# Patient Record
Sex: Female | Born: 2000 | Race: Black or African American | Hispanic: No | Marital: Single | Smoking: Never smoker
Health system: Southern US, Community
[De-identification: ages and names within clinical notes are randomized; demographics above are authoritative.]

## PROBLEM LIST (undated history)

## (undated) DIAGNOSIS — J45909 Unspecified asthma, uncomplicated: Secondary | ICD-10-CM

## (undated) DIAGNOSIS — Z789 Other specified health status: Secondary | ICD-10-CM

## (undated) HISTORY — PX: EYE SURGERY: SHX253

---

## 2020-01-26 DIAGNOSIS — R519 Headache, unspecified: Secondary | ICD-10-CM | POA: Insufficient documentation

## 2020-08-19 DIAGNOSIS — J4540 Moderate persistent asthma, uncomplicated: Secondary | ICD-10-CM | POA: Insufficient documentation

## 2020-10-14 ENCOUNTER — Ambulatory Visit (HOSPITAL_COMMUNITY): Payer: Medicaid Other | Admitting: Licensed Clinical Social Worker

## 2020-10-14 DIAGNOSIS — F489 Nonpsychotic mental disorder, unspecified: Secondary | ICD-10-CM

## 2020-10-14 NOTE — Progress Notes (Signed)
Therapist contacted patient by text for session and patient did not respond. Session is a no show

## 2021-04-10 ENCOUNTER — Ambulatory Visit (INDEPENDENT_AMBULATORY_CARE_PROVIDER_SITE_OTHER): Payer: Medicaid Other | Admitting: Licensed Clinical Social Worker

## 2021-04-10 DIAGNOSIS — F321 Major depressive disorder, single episode, moderate: Secondary | ICD-10-CM | POA: Diagnosis not present

## 2021-04-10 DIAGNOSIS — F411 Generalized anxiety disorder: Secondary | ICD-10-CM | POA: Diagnosis not present
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
## 2021-05-14 ENCOUNTER — Ambulatory Visit (INDEPENDENT_AMBULATORY_CARE_PROVIDER_SITE_OTHER): Payer: Medicaid Other | Admitting: Licensed Clinical Social Worker

## 2021-05-14 DIAGNOSIS — F411 Generalized anxiety disorder: Secondary | ICD-10-CM | POA: Diagnosis not present

## 2021-05-14 DIAGNOSIS — F321 Major depressive disorder, single episode, moderate: Secondary | ICD-10-CM

## 2021-05-14 NOTE — Progress Notes (Signed)
Virtual Visit via Video Note  I connected with Joann Reid on 05/14/21 at 10:00 AM EDT by a video enabled telemedicine application and verified that I am speaking with the correct person using two identifiers.  Location: Patient: home office Provider: stationary car   I discussed the limitations of evaluation and management by telemedicine and the availability of in person appointments. The patient expressed understanding and agreed to proceed.  I discussed the assessment and treatment plan with the patient. The patient was provided an opportunity to ask questions and all were answered. The patient agreed with the plan and demonstrated an understanding of the instructions.   The patient was advised to call back or seek an in-person evaluation if the symptoms worsen or if the condition fails to improve as anticipated.  I provided 53 minutes of non-face-to-face time during this encounter.  THERAPIST PROGRESS NOTE  Session Time: 10:00 AM to 10:53 AM  Participation Level: Active  Behavioral Response: CasualAlertEuthymic  Type of Therapy: Individual Therapy  Treatment Goals addressed:  Work on more confidence, better lifestyle, depression, anxiety, anger, coping Interventions: Solution Focused, Strength-based, Supportive, Anger Management Training, and Other: coping  Summary: Joann Reid is a 20 y.o. female who presents with symptoms a little bit better not as angery as much not as hopeless and depressed. Anger better because not a home. Mostly at work and go home to sleep. Doesn't like those people at home her sister and boyfriend, mom but doesn't bother her. "They think I am a  slave." Explored why they expect that as they are adults. Sister has 6 month and expects triplets expects patient to always watch the baby. Sometimes want to help but take advantage of it so don't. Outlets boyfriend's house but doesn't like driving to [place] all the time. 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 Likes her jobs, some days more stressful with working with kids. Shares she doesn't like people give her anxiety and somebody always wants something from you. Noted since young all relationships people asking something from her impacts how she sees relationships and she agrees.  Worked on anger as it triggers other symptoms of depression and anxiety. Anger that is been build up related to early experiences of having to watch the kids all the time when younger, patient having to be a parent and not a kid.  Did helping to identify that is helpful in managing anger.  Reviewed anger management handout patient still thinks most helpful strategy is separating from family therapist did note this is a strategy to manage anger.  Noted self soothing for patient that she goes to her car, brings a candle and listens to music.  Reviewed session and patient said helped a little therapist emphasized constructive anger, containing her anger, constructive anger is more helpful in managing issues, walk away as a way to calm down emotional brain.  Therapist reviewed symptoms, facilitated expression of thoughts and feelings noted some progress in patient's symptoms noted less anger impacting other symptoms like depression.  Noted helpful for patient to separate from her trigger which is her family.  Completed treatment plan and patient gave verbal consent to complete virtually.  Worked on Tour manager and highlight important parts including behind anger is unmet needs so it is important to listen to the anger so can get needs met.  Focused on goal of working on constructive anger as it its effective well  destructive anger is not also destructive anger is about putting your needs ahead of other people that interferes with things like relationships.  Provided method for managing anger including motivating saying the cost of anger,  containing the anger and afterwards listening when you are calmer.  Also noted helpful to soften anger through constructive ways to look at situations and provided examples.  Noted patient is motivated as she wants to work on anger.  Noted important part of managing anger is containing it, one of the most effective ways is to walk away as emotional brain calms down and then reconnect to prefrontal cortex, usually takes about 20 minutes, soothing activities are helpful, noticing what you are grateful for noticing something you like about the person you are angry with.  The final step is listening noted as well can relate to all unrealistic expectations should statements can feed anger better to say I want to lower expectations.  Therapist provided active listening open questions, supportive interventions. Suicidal/Homicidal: No  Plan: Return again in 6 weeks.2.  Review anger management she and reviewed with patient anger management strategies that are helpful. 2.  Work on self-esteem using handout, from anxiety book, very well mind  Diagnosis: Axis I:  major depressive episode, single episode, mild, generalized anxiety disorder    Axis II: No diagnosis    Cordella Register, LCSW 05/14/2021

## 2021-06-25 ENCOUNTER — Ambulatory Visit (HOSPITAL_COMMUNITY): Payer: Medicaid Other | Admitting: Licensed Clinical Social Worker

## 2021-10-30 DIAGNOSIS — K589 Irritable bowel syndrome without diarrhea: Secondary | ICD-10-CM | POA: Insufficient documentation

## 2021-11-30 NOTE — L&D Delivery Note (Signed)
OB/GYN Faculty Practice Delivery Note  Joann Reid is a 21 y.o. G1P0 s/p VD at [redacted]w[redacted]d. She was admitted for IOL gHTN.   ROM: 9h 27m with Clear fluid GBS Status:  Negative/-- (11/09 0000)  Labor Progress: Initial SVE: 1.5/50/-2. She then progressed to complete.   Delivery Date/Time: 10/25/22 0216 Delivery: Called to room and patient was complete and pushing. Head delivered LOA. No nuchal cord present. Shoulder and body delivered in usual fashion. Infant with spontaneous cry, placed on mother's abdomen, dried and stimulated. Cord clamped x 2 after 1-minute delay, and cut by family. Cord blood drawn. Placenta delivered spontaneously with gentle cord traction. Fundus firm with massage and Pitocin. Labia, perineum, vagina, and cervix inspected with 2nd degree laceration and bilateral periurethral lacerations, repaired in usual fashion.  Baby Weight: pending  Placenta: 3 vessel, intact. Sent to L&D Complications: None Lacerations: 2nd degree and bilateral periurethral lacerations EBL: 502 mL Analgesia: Epidural   Infant:  APGAR (1 MIN): 7   APGAR (5 MINS): 8    Myrtie Hawk, DO OB Family Medicine Fellow, [facility] for [employer], [facility] 10/25/2022, 2:57 AM

## 2022-04-01 ENCOUNTER — Encounter: Payer: Self-pay | Admitting: Obstetrics & Gynecology

## 2022-04-20 ENCOUNTER — Other Ambulatory Visit (HOSPITAL_COMMUNITY)
Admission: RE | Admit: 2022-04-20 | Discharge: 2022-04-20 | Disposition: A | Discharge status: Home / Self Care | Payer: Managed Care, Other (non HMO) | Source: Ambulatory Visit | Attending: Obstetrics & Gynecology | Admitting: Obstetrics & Gynecology

## 2022-04-20 ENCOUNTER — Ambulatory Visit (INDEPENDENT_AMBULATORY_CARE_PROVIDER_SITE_OTHER): Payer: Medicaid Other | Admitting: Obstetrics & Gynecology

## 2022-04-20 ENCOUNTER — Encounter: Payer: Self-pay | Admitting: Obstetrics & Gynecology

## 2022-04-20 VITALS — BP 123/75 | HR 81 | Ht 65.0 in | Wt 212.0 lb

## 2022-04-20 DIAGNOSIS — Z3402 Encounter for supervision of normal first pregnancy, second trimester: Secondary | ICD-10-CM | POA: Diagnosis present

## 2022-04-20 DIAGNOSIS — Z3A13 13 weeks gestation of pregnancy: Secondary | ICD-10-CM | POA: Diagnosis not present

## 2022-04-20 DIAGNOSIS — Z3400 Encounter for supervision of normal first pregnancy, unspecified trimester: Secondary | ICD-10-CM | POA: Insufficient documentation

## 2022-04-20 DIAGNOSIS — Z3401 Encounter for supervision of normal first pregnancy, first trimester: Secondary | ICD-10-CM

## 2022-04-20 LAB — HEPATITIS C ANTIBODY: HCV Ab: NEGATIVE

## 2022-04-20 MED ORDER — ONDANSETRON HCL 4 MG PO TABS: 4.0000 mg | ORAL_TABLET | Freq: Three times a day (TID) | ORAL | 0 refills | Status: DC | PRN

## 2022-04-20 MED ORDER — POLYETHYLENE GLYCOL 3350 17 GM/SCOOP PO POWD: 1.0000 | Freq: Once | ORAL | Status: DC

## 2022-04-20 MED ORDER — BLOOD PRESSURE CUFF MISC: 1.0000 | 0 refills | Status: DC | PRN

## 2022-04-20 NOTE — Progress Notes (Signed)
Bedside TAU shows single IUP with FHT of 160 BPM and HC is measuring [redacted]w[redacted]d.

## 2022-04-20 NOTE — Progress Notes (Signed)
  Subjective:    Joann Reid is a G1P0 [redacted]w[redacted]d being seen today for her first obstetrical visit.  Her obstetrical history is significant for obesity. Patient does not intend to breast feed. Pt encouraged to consider.  Patient reports fatigue, nausea, and vomiting.  Vitals:   04/16/22 1631  BP: 123/75  Pulse: 81  Weight: 212 lb (96.2 kg)  Height: 5\' 5"  (1.651 m)    HISTORY: OB History  Gravida Para Term Preterm AB Living  1            SAB IAB Ectopic Multiple Live Births               # Outcome Date GA Lbr Len/2nd Weight Sex Delivery Anes PTL Lv  1 Current            History reviewed. No pertinent past medical history. Past Surgical History:  Procedure Laterality Date   EYE SURGERY     Family History  Problem Relation Age of Onset   Hypertension Mother    Diabetes Maternal Grandmother      Exam    Uterus:     Pelvic Exam:    Perineum: No Hemorrhoids   Vulva: normal   Vagina:  normal mucosa, normal discharge   pH: N/a   Cervix: no bleeding following Pap   Adnexa: normal adnexa   Bony Pelvis: average  System: Breast:  normal appearance, no masses or tenderness   Skin: normal coloration and turgor, no rashes    Neurologic: oriented, normal mood   Extremities: no deformities   HEENT sclera clear, anicteric, oropharynx clear, no lesions, neck supple with midline trachea, thyroid without masses, and trachea midline   Mouth/Teeth mucous membranes moist, pharynx normal without lesions and dental hygiene good   Neck supple and no masses   Cardiovascular: regular rate and rhythm   Respiratory:  appears well, vitals normal, no respiratory distress, acyanotic, normal RR, chest clear, no wheezing, crepitations, rhonchi, normal symmetric air entry   Abdomen: soft, non-tender; bowel sounds normal; no masses,  no organomegaly   Urinary: urethral meatus normal      Assessment:    Pregnancy: G1P0 Patient Active Problem List   Diagnosis Date Noted   Supervision of  normal first pregnancy 04/20/2022   IBS (irritable bowel syndrome) 10/30/2021   Moderate persistent asthma 08/19/2020   Nonintractable episodic headache 01/26/2020        Plan:   Initial labs drawn. Prenatal vitamins. Problem list reviewed and updated. Genetic Screening discussed:  Panorama today and AFP at next visit  Ultrasound discussed; fetal survey: ordered.  Follow up in 4 weeks.  BP surveillance:  Rx for BP cuff given  N/V--prescription for Zofran given.  Patient has lost 1 pound since pregnancy began.  We did discuss total weight and being approximately 12 pounds.  No history of diabetes or gestational diabetes in her family.  Father of baby and Joann Reid are not currently together.  Her mother, also a patient in the practice, was present for the visit and supportive.  RTC 4-5 weeks Pt wants a gender reveal party so we should not reveal the sex through testing nor [place].   Joann Reid 04/20/2022

## 2022-04-21 ENCOUNTER — Encounter: Payer: Self-pay | Admitting: Obstetrics & Gynecology

## 2022-04-21 LAB — HEPATITIS C ANTIBODY
Hepatitis C Ab: NONREACTIVE
SIGNAL TO CUT-OFF: 0.21 (ref <1.00)

## 2022-04-21 LAB — OBSTETRIC PANEL
Absolute Monocytes: 533 cells/uL (ref 200–950)
Antibody Screen: NOT DETECTED
Basophils Absolute: 17 cells/uL (ref 0–200)
Basophils Relative: 0.2 %
Eosinophils Absolute: 17 cells/uL (ref 15–500)
Eosinophils Relative: 0.2 %
HCT: 38.1 % (ref 35.0–45.0)
Hemoglobin: 12.9 g/dL (ref 11.7–15.5)
Hepatitis B Surface Ag: NONREACTIVE
Lymphs Abs: 1909 cells/uL (ref 850–3900)
MCH: 32.1 pg (ref 27.0–33.0)
MCHC: 33.9 g/dL (ref 32.0–36.0)
MCV: 94.8 fL (ref 80.0–100.0)
MPV: 10.5 fL (ref 7.5–12.5)
Monocytes Relative: 6.2 %
Neutro Abs: 6123 cells/uL (ref 1500–7800)
Neutrophils Relative %: 71.2 %
Platelets: 254 10*3/uL (ref 140–400)
RBC: 4.02 10*6/uL (ref 3.80–5.10)
RDW: 12.2 % (ref 11.0–15.0)
RPR Ser Ql: NONREACTIVE
Rubella: 1.57 Index
Total Lymphocyte: 22.2 %
WBC: 8.6 10*3/uL (ref 3.8–10.8)

## 2022-04-21 LAB — CERVICOVAGINAL ANCILLARY ONLY
Chlamydia: NEGATIVE
Comment: NEGATIVE
Comment: NEGATIVE
Comment: NORMAL
Neisseria Gonorrhea: NEGATIVE
Trichomonas: NEGATIVE

## 2022-04-21 LAB — CYTOLOGY - PAP
Chlamydia: NEGATIVE
Comment: NEGATIVE
Comment: NORMAL
Diagnosis: NEGATIVE
Neisseria Gonorrhea: NEGATIVE

## 2022-04-21 LAB — HIV ANTIBODY (ROUTINE TESTING W REFLEX): HIV 1&2 Ab, 4th Generation: NONREACTIVE

## 2022-04-23 ENCOUNTER — Telehealth: Payer: Self-pay | Admitting: *Deleted

## 2022-04-23 MED ORDER — POLYETHYLENE GLYCOL 3350 17 GM/SCOOP PO POWD: 3.0000 | Freq: Once | ORAL | 0 refills | Status: AC

## 2022-04-23 NOTE — Telephone Encounter (Signed)
RX for Miralax sent to [facility] in [place] per Dr Leggett's office note.

## 2022-04-24 LAB — CULTURE, OB URINE

## 2022-04-24 LAB — URINE CULTURE, OB REFLEX

## 2022-04-30 DIAGNOSIS — Z3402 Encounter for supervision of normal first pregnancy, second trimester: Secondary | ICD-10-CM

## 2022-05-04 ENCOUNTER — Encounter: Payer: Self-pay | Admitting: *Deleted

## 2022-05-04 DIAGNOSIS — Z3402 Encounter for supervision of normal first pregnancy, second trimester: Secondary | ICD-10-CM

## 2022-05-11 ENCOUNTER — Inpatient Hospital Stay (HOSPITAL_COMMUNITY)
Admission: AD | Admit: 2022-05-11 | Discharge: 2022-05-11 | Disposition: A | Discharge status: Home / Self Care | Payer: Medicaid Other | Attending: Obstetrics and Gynecology | Admitting: Obstetrics and Gynecology

## 2022-05-11 ENCOUNTER — Encounter: Payer: Self-pay | Admitting: Obstetrics & Gynecology

## 2022-05-11 DIAGNOSIS — Z3A16 16 weeks gestation of pregnancy: Secondary | ICD-10-CM | POA: Insufficient documentation

## 2022-05-11 DIAGNOSIS — B9689 Other specified bacterial agents as the cause of diseases classified elsewhere: Secondary | ICD-10-CM

## 2022-05-11 DIAGNOSIS — O219 Vomiting of pregnancy, unspecified: Secondary | ICD-10-CM | POA: Diagnosis not present

## 2022-05-11 DIAGNOSIS — Z3402 Encounter for supervision of normal first pregnancy, second trimester: Secondary | ICD-10-CM

## 2022-05-11 DIAGNOSIS — O26892 Other specified pregnancy related conditions, second trimester: Secondary | ICD-10-CM | POA: Insufficient documentation

## 2022-05-11 DIAGNOSIS — M549 Dorsalgia, unspecified: Secondary | ICD-10-CM | POA: Diagnosis present

## 2022-05-11 DIAGNOSIS — M545 Low back pain, unspecified: Secondary | ICD-10-CM

## 2022-05-11 LAB — URINALYSIS, ROUTINE W REFLEX MICROSCOPIC
Bilirubin Urine: NEGATIVE
Glucose, UA: NEGATIVE mg/dL
Hgb urine dipstick: NEGATIVE
Ketones, ur: 80 mg/dL — AB
Leukocytes,Ua: NEGATIVE
Nitrite: NEGATIVE
Protein, ur: NEGATIVE mg/dL
Specific Gravity, Urine: 1.023 (ref 1.005–1.030)
pH: 5 (ref 5.0–8.0)

## 2022-05-11 LAB — WET PREP, GENITAL
Sperm: NONE SEEN
Trich, Wet Prep: NONE SEEN
WBC, Wet Prep HPF POC: >=10 — AB (ref <10)
Yeast Wet Prep HPF POC: NONE SEEN

## 2022-05-11 MED ORDER — METOCLOPRAMIDE HCL 10 MG PO TABS: 10.0000 mg | ORAL_TABLET | Freq: Once | ORAL | Status: DC

## 2022-05-11 MED ORDER — CYCLOBENZAPRINE HCL 5 MG PO TABS
10.0000 mg | ORAL_TABLET | Freq: Once | ORAL | Status: AC
  Administered 2022-05-11: 10 mg via ORAL
  Filled 2022-05-11: qty 2

## 2022-05-11 MED ORDER — METOCLOPRAMIDE HCL 10 MG PO TABS
10.0000 mg | ORAL_TABLET | Freq: Once | ORAL | Status: AC
Start: 2022-05-11 — End: 2022-05-11
  Administered 2022-05-11: 10 mg via ORAL
  Filled 2022-05-11: qty 1

## 2022-05-11 MED ORDER — PROMETHAZINE HCL 12.5 MG PO TABS: 12.5000 mg | ORAL_TABLET | Freq: Four times a day (QID) | ORAL | 0 refills | Status: DC | PRN

## 2022-05-11 MED ORDER — METRONIDAZOLE 0.75 % VA GEL
1.0000 | Freq: Every day | VAGINAL | 0 refills | Status: DC
Start: 2022-05-11 — End: 2022-05-21

## 2022-05-11 NOTE — MAU Provider Note (Signed)
History     CSN: [id number]  Arrival date and time: 05/11/22 1925   Event Date/Time   First Provider Initiated Contact with Patient 05/11/22 2100      Chief Complaint  Patient presents with   Vaginal Bleeding   Joann Reid is a 21 y.o. G1P0 at [redacted]w[redacted]d who receives care at [facility]. Patient reports her next appt is June 22.   She presents today for Vaginal Spotting. Joann Reid reports the spotting started this morning around 0200.  She states it is "like you are about to come off your cycle" and endorses that it is light pink in color.  She also endorses cramping that is constant that started 2 weeks ago.  She has not taken any medication for the cramping and reports certain positions cause worsening of her symptoms.  She rates the pain a 10/10.  She also reports nausea and ~ 6 bouts of vomiting today.  She reports taking one Zofran today at 0700 with no relief of symptoms.     OB History     Gravida  1   Para      Term      Preterm      AB      Living         SAB      IAB      Ectopic      Multiple      Live Births              No past medical history on file.  Past Surgical History:  Procedure Laterality Date   EYE SURGERY      Family History  Problem Relation Age of Onset   Hypertension Mother    Diabetes Maternal Grandmother     Social History   Tobacco Use   Smoking status: Never   Smokeless tobacco: Never  Vaping Use   Vaping Use: Never used  Substance Use Topics   Alcohol use: Never   Drug use: Never    Allergies:  Allergies  Allergen Reactions   Hydrocodone     Facility-Administered Medications Prior to Admission  Medication Dose Route Frequency Provider Last Rate Last Admin   polyethylene glycol powder (GLYCOLAX/MIRALAX) container 255 g  1 Container Oral Once Joann Dukes, MD       Medications Prior to Admission  Medication Sig Dispense Refill Last Dose   Blood Pressure Monitoring (BLOOD PRESSURE CUFF) MISC 1  Device by Does not apply route as needed. 1 each 0    ondansetron (ZOFRAN) 4 MG tablet Take 1 tablet (4 mg total) by mouth every 8 (eight) hours as needed for nausea or vomiting. 20 tablet 0    Prenatal Vit-Fe Fumarate-FA (MULTIVITAMIN-PRENATAL) 27-0.8 MG TABS tablet Take 1 tablet by mouth daily at 12 noon.       Review of Systems  Constitutional:  Negative for chills and fever.  Eyes:  Negative for visual disturbance.  Gastrointestinal:  Positive for abdominal pain, nausea and vomiting (Vomiting upon arrival). Negative for constipation and diarrhea.  Genitourinary:  Positive for vaginal bleeding (Spotting). Negative for difficulty urinating, dysuria and vaginal discharge.  Skin:        "Feels like my skin is on fire."  Neurological:  Positive for headaches. Negative for dizziness and light-headedness.   Physical Exam   Blood pressure 119/70, pulse 87, temperature 98.1 F (36.7 C), resp. rate 18, height 5\' 5"  (1.651 m), weight 96.1 kg, last menstrual period 01/14/2022.  Physical Exam Vitals  reviewed.  Constitutional:      General: She is not in acute distress.    Appearance: Normal appearance. She is not ill-appearing.  HENT:     Head: Normocephalic and atraumatic.  Eyes:     Conjunctiva/sclera: Conjunctivae normal.  Cardiovascular:     Rate and Rhythm: Normal rate and regular rhythm.  Pulmonary:     Effort: Pulmonary effort is normal. No respiratory distress.     Breath sounds: Normal breath sounds.  Abdominal:     General: Bowel sounds are normal.     Palpations: Abdomen is soft.     Tenderness: There is abdominal tenderness in the right lower quadrant and left lower quadrant. There is no right CVA tenderness or left CVA tenderness.  Musculoskeletal:     Lumbar back: Tenderness (Right lower side only) present.     Right lower leg: No edema.     Left lower leg: No edema.  Skin:    General: Skin is warm and dry.  Neurological:     Mental Status: She is alert and oriented  to person, place, and time.  Psychiatric:        Mood and Affect: Mood normal.        Behavior: Behavior normal.     MAU Course  Procedures Results for orders placed or performed during the hospital encounter of 05/11/22 (from the past 24 hour(s))  Wet prep, genital     Status: Abnormal   Collection Time: 05/11/22  8:15 PM   Specimen: PATH Cytology Cervicovaginal Ancillary Only  Result Value Ref Range   Yeast Wet Prep HPF POC NONE SEEN NONE SEEN   Trich, Wet Prep NONE SEEN NONE SEEN   Clue Cells Wet Prep HPF POC PRESENT (A) NONE SEEN   WBC, Wet Prep HPF POC >=10 (A) <10   Sperm NONE SEEN   Urinalysis, Routine w reflex microscopic Urine, Clean Catch     Status: Abnormal   Collection Time: 05/11/22  8:26 PM  Result Value Ref Range   Color, Urine YELLOW YELLOW   APPearance HAZY (A) CLEAR   Specific Gravity, Urine 1.023 1.005 - 1.030   pH 5.0 5.0 - 8.0   Glucose, UA NEGATIVE NEGATIVE mg/dL   Hgb urine dipstick NEGATIVE NEGATIVE   Bilirubin Urine NEGATIVE NEGATIVE   Ketones, ur 80 (A) NEGATIVE mg/dL   Protein, ur NEGATIVE NEGATIVE mg/dL   Nitrite NEGATIVE NEGATIVE   Leukocytes,Ua NEGATIVE NEGATIVE    MDM Exam Labs: UA, Wet Prep, GC/CT AntiEmetics Muscle Relaxant Assessment and Plan  21 year old SIUP at 16.5 weeks Cramping Spotting Back pain  -POC Reviewed. -Swabs collected by patient. -Discussed results c/w bacterial vaginosis. -Treatment to be sent to pharmacy. -Discussed pain management and patient agreeable after conversation. -Reglan and Flexeril ordered. -Monitor and reassess.   Joann Reid 05/11/2022, 9:00 PM   Reassessment (10:46 PM)  -Patient reports no vomiting, but some "dry heaving." -Discussed management of back pain at home. -Encouraged usage of warm compresses and tylenol prn.  -Rx for phenergan and reglan sent to pharmacy on file.  -Patient endorses being around marijuana smoke, but not using during pregnancy.  Encouraged limiting exposure  and continued cessation. -Instructed to keep appt as scheduled. -Encouraged to call primary office or return to MAU if symptoms worsen or with the onset of new symptoms. -Discharged to home in stable condition.  Joann Robins MSN, CNM Advanced Practice Provider, [facility] [employer]

## 2022-05-11 NOTE — MAU Note (Addendum)
Pt says she has VB- started  at 0200- when she wiped  and has continued through day . Feels cramps - started at 0800- no meds - says Tylenol and ibuprofen does not help her when she has pain. [facility]- [place]  Last sex- 03-30-2022 with different partner

## 2022-05-12 LAB — GC/CHLAMYDIA PROBE AMP (~~LOC~~) NOT AT ARMC
Chlamydia: NEGATIVE
Comment: NEGATIVE
Comment: NORMAL
Neisseria Gonorrhea: NEGATIVE

## 2022-05-13 ENCOUNTER — Encounter: Payer: Self-pay | Admitting: Obstetrics & Gynecology

## 2022-05-21 ENCOUNTER — Ambulatory Visit (INDEPENDENT_AMBULATORY_CARE_PROVIDER_SITE_OTHER): Payer: Medicaid Other | Admitting: Obstetrics and Gynecology

## 2022-05-21 ENCOUNTER — Inpatient Hospital Stay (HOSPITAL_COMMUNITY): Payer: Medicaid Other

## 2022-05-21 ENCOUNTER — Encounter: Payer: Self-pay | Admitting: Obstetrics and Gynecology

## 2022-05-21 ENCOUNTER — Encounter (HOSPITAL_COMMUNITY): Payer: Self-pay | Admitting: Family Medicine

## 2022-05-21 ENCOUNTER — Encounter: Payer: Self-pay | Admitting: Certified Nurse Midwife

## 2022-05-21 ENCOUNTER — Inpatient Hospital Stay (HOSPITAL_COMMUNITY)
Admission: AD | Admit: 2022-05-21 | Discharge: 2022-05-21 | Disposition: A | Discharge status: Home / Self Care | Payer: Medicaid Other | Attending: Family Medicine | Admitting: Family Medicine

## 2022-05-21 VITALS — BP 118/76 | HR 90 | Temp 98.9°F | Wt 211.0 lb

## 2022-05-21 DIAGNOSIS — R1032 Left lower quadrant pain: Secondary | ICD-10-CM | POA: Diagnosis not present

## 2022-05-21 DIAGNOSIS — N949 Unspecified condition associated with female genital organs and menstrual cycle: Secondary | ICD-10-CM

## 2022-05-21 DIAGNOSIS — R309 Painful micturition, unspecified: Secondary | ICD-10-CM

## 2022-05-21 DIAGNOSIS — Z3A18 18 weeks gestation of pregnancy: Secondary | ICD-10-CM

## 2022-05-21 DIAGNOSIS — R102 Pelvic and perineal pain: Secondary | ICD-10-CM | POA: Insufficient documentation

## 2022-05-21 DIAGNOSIS — Z3402 Encounter for supervision of normal first pregnancy, second trimester: Secondary | ICD-10-CM | POA: Diagnosis not present

## 2022-05-21 DIAGNOSIS — O26892 Other specified pregnancy related conditions, second trimester: Secondary | ICD-10-CM | POA: Diagnosis present

## 2022-05-21 HISTORY — DX: Other specified health status: Z78.9

## 2022-05-21 LAB — CBC WITH DIFFERENTIAL/PLATELET
Abs Immature Granulocytes: 0.05 10*3/uL (ref 0.00–0.07)
Basophils Absolute: 0 10*3/uL (ref 0.0–0.1)
Basophils Relative: 0 %
Eosinophils Absolute: 0 10*3/uL (ref 0.0–0.5)
Eosinophils Relative: 0 %
HCT: 39.3 % (ref 36.0–46.0)
Hemoglobin: 13.5 g/dL (ref 12.0–15.0)
Immature Granulocytes: 0 %
Lymphocytes Relative: 17 %
Lymphs Abs: 2 10*3/uL (ref 0.7–4.0)
MCH: 31.7 pg (ref 26.0–34.0)
MCHC: 34.4 g/dL (ref 30.0–36.0)
MCV: 92.3 fL (ref 80.0–100.0)
Monocytes Absolute: 0.7 10*3/uL (ref 0.1–1.0)
Monocytes Relative: 6 %
Neutro Abs: 8.9 10*3/uL — ABNORMAL HIGH (ref 1.7–7.7)
Neutrophils Relative %: 77 %
Platelets: 263 10*3/uL (ref 150–400)
RBC: 4.26 MIL/uL (ref 3.87–5.11)
RDW: 11.6 % (ref 11.5–15.5)
WBC: 11.7 10*3/uL — ABNORMAL HIGH (ref 4.0–10.5)
nRBC: 0 % (ref 0.0–0.2)

## 2022-05-21 LAB — POCT URINALYSIS DIPSTICK
Bilirubin, UA: NEGATIVE
Glucose, UA: NEGATIVE
Ketones, UA: NEGATIVE
Nitrite, UA: NEGATIVE
Protein, UA: NEGATIVE
Spec Grav, UA: 1.01 (ref 1.010–1.025)
Urobilinogen, UA: NEGATIVE E.U./dL — AB
pH, UA: 7 (ref 5.0–8.0)

## 2022-05-21 LAB — TYPE AND SCREEN
ABO/RH(D): O POS
Antibody Screen: NEGATIVE

## 2022-05-21 LAB — URINALYSIS, ROUTINE W REFLEX MICROSCOPIC
Bilirubin Urine: NEGATIVE
Glucose, UA: NEGATIVE mg/dL
Ketones, ur: NEGATIVE mg/dL
Nitrite: NEGATIVE
Protein, ur: NEGATIVE mg/dL
Specific Gravity, Urine: 1.019 (ref 1.005–1.030)
pH: 5 (ref 5.0–8.0)

## 2022-05-21 LAB — COMPREHENSIVE METABOLIC PANEL
ALT: 12 U/L (ref 0–44)
AST: 19 U/L (ref 15–41)
Albumin: 3.5 g/dL (ref 3.5–5.0)
Alkaline Phosphatase: 68 U/L (ref 38–126)
Anion gap: 10 (ref 5–15)
BUN: <5 mg/dL — ABNORMAL LOW (ref 6–20)
CO2: 19 mmol/L — ABNORMAL LOW (ref 22–32)
Calcium: 9.4 mg/dL (ref 8.9–10.3)
Chloride: 106 mmol/L (ref 98–111)
Creatinine, Ser: 0.6 mg/dL (ref 0.44–1.00)
GFR, Estimated: >60 mL/min (ref >60)
Glucose, Bld: 97 mg/dL (ref 70–99)
Potassium: 3.9 mmol/L (ref 3.5–5.1)
Sodium: 135 mmol/L (ref 135–145)
Total Bilirubin: 0.3 mg/dL (ref 0.3–1.2)
Total Protein: 6.9 g/dL (ref 6.5–8.1)

## 2022-05-21 LAB — AMNISURE RUPTURE OF MEMBRANE (ROM) NOT AT ARMC: Amnisure ROM: NEGATIVE

## 2022-05-21 MED ORDER — MORPHINE SULFATE (PF) 4 MG/ML IV SOLN
1.0000 mg | Freq: Once | INTRAVENOUS | Status: AC
  Administered 2022-05-21: 1 mg via INTRAVENOUS
  Filled 2022-05-21: qty 1

## 2022-05-21 MED ORDER — CYCLOBENZAPRINE HCL 10 MG PO TABS: 10.0000 mg | ORAL_TABLET | Freq: Two times a day (BID) | ORAL | 0 refills | Status: DC | PRN

## 2022-05-21 MED ORDER — MISC. DEVICES MISC
0 refills | Status: DC
Start: 2022-05-21 — End: 2022-10-25

## 2022-05-21 MED ORDER — METRONIDAZOLE 0.75 % VA GEL
1.0000 | Freq: Every day | VAGINAL | 1 refills | Status: DC
Start: 2022-05-21 — End: 2022-06-19

## 2022-05-21 MED ORDER — LACTATED RINGERS IV BOLUS
1000.0000 mL | Freq: Once | INTRAVENOUS | Status: AC
  Administered 2022-05-21: 1000 mL via INTRAVENOUS

## 2022-05-21 NOTE — Progress Notes (Signed)
C/O left abd pain and pressure with urination. UA dip is positive for blood and culture sent

## 2022-05-21 NOTE — MAU Provider Note (Cosign Needed)
History     CSN: [id number]  Arrival date and time: 05/21/22 1254   Event Date/Time   First Provider Initiated Contact with Patient 05/21/22 1329      Chief Complaint  Patient presents with   Abdominal Pain   HPI Joann Reid is a 21 y.o. G1P0 at [redacted]w[redacted]d who presents to MAU from [facility] with concern for Pyelonephritis. On arrival to MAU patient reports LLQ abdominal pain. This is a new problem, onset three days ago. Pain score is 10/10. Pain does not radiate. Pain is unchanged since onset and she denies alleviating factors. Pain is aggravated by walking and repositioning. She has not taken medication for this complaint.   Patient reports recurrent constipation. Prior to pregnancy she had a bowel movement every day. Now she has one bowel movement every three days. Most recent was this morning.  Patient reported LLQ pain with radiation to left flank as well as dysuria during her office appointment this morning. She denies both flank pain and dysuria on arrival to MAU and on CNM initial assessment.   Patient reports falling at work on March 26, 2022. She states she fell onto her left side "and its all been downhill since then". She reports intermittent tingling in her right leg. She also reports soreness at her left hip flexor.  OB History     Gravida  1   Para      Term      Preterm      AB      Living         SAB      IAB      Ectopic      Multiple      Live Births              Past Medical History:  Diagnosis Date   Medical history non-contributory     Past Surgical History:  Procedure Laterality Date   EYE SURGERY      Family History  Problem Relation Age of Onset   Hypertension Mother    Diabetes Maternal Grandmother     Social History   Tobacco Use   Smoking status: Never   Smokeless tobacco: Never  Vaping Use   Vaping Use: Never used  Substance Use Topics   Alcohol use: Never   Drug use: Never    Allergies:  Allergies   Allergen Reactions   Hydrocodone     Facility-Administered Medications Prior to Admission  Medication Dose Route Frequency Provider Last Rate Last Admin   polyethylene glycol powder (GLYCOLAX/MIRALAX) container 255 g  1 Container Oral Once Lesly Dukes, MD       Medications Prior to Admission  Medication Sig Dispense Refill Last Dose   Blood Pressure Monitoring (BLOOD PRESSURE CUFF) MISC 1 Device by Does not apply route as needed. 1 each 0 05/21/2022   Prenatal Vit-Fe Fumarate-FA (MULTIVITAMIN-PRENATAL) 27-0.8 MG TABS tablet Take 1 tablet by mouth daily at 12 noon.   05/21/2022   ondansetron (ZOFRAN) 4 MG tablet Take 1 tablet (4 mg total) by mouth every 8 (eight) hours as needed for nausea or vomiting. 20 tablet 0     Review of Systems  Constitutional:  Negative for fever.  Gastrointestinal:  Positive for abdominal pain.  Genitourinary:  Positive for vaginal discharge.       + flank pain in office, denies flank pain in MAU  All other systems reviewed and are negative.  Physical Exam   Blood pressure 122/73,  pulse (!) 101, temperature 98.5 F (36.9 C), resp. rate 18, height 5\' 5"  (1.651 m), weight 96.2 kg, last menstrual period 01/14/2022.  Physical Exam Vitals and nursing note reviewed. Exam conducted with a chaperone present.  Constitutional:      Appearance: She is well-developed. She is not ill-appearing.  Cardiovascular:     Rate and Rhythm: Normal rate and regular rhythm.  Pulmonary:     Effort: Pulmonary effort is normal.  Abdominal:     Tenderness: There is abdominal tenderness. There is no right CVA tenderness, left CVA tenderness or rebound.     Comments: Gravid  Genitourinary:    Comments: Pelvic exam: External genitalia normal, vaginal walls pink and well rugated, cervix visually closed, no lesions noted. Negative pooling   Skin:    Capillary Refill: Capillary refill takes less than 2 seconds.  Neurological:     Mental Status: She is alert and oriented to  person, place, and time.  Psychiatric:        Mood and Affect: Mood normal.        Behavior: Behavior normal.    MAU Course  Procedures  MDM --Pertinent negatives: no CVAT, no fever, no abnormal vital signs --Intact amniotic sac: negative pooling, negative Amnisure --Dr. 01/16/2022 at bedside to discuss discharge per request from patient and support person "Lyric" -- Per Dr. Crissie Reese conversation with patient, she requests rx for Metrogel --Urine culture collected in office PTA to MAU  Patient Vitals for the past 24 hrs:  BP Temp Pulse Resp SpO2 Height Weight  05/21/22 1631 -- -- -- -- 100 % -- --  05/21/22 1629 (!) 118/58 98.2 F (36.8 C) 83 16 -- -- --  05/21/22 1304 122/73 98.5 F (36.9 C) (!) 101 18 -- 5\' 5"  (1.651 m) 96.2 kg   Results for orders placed or performed during the hospital encounter of 05/21/22 (from the past 24 hour(s))  Urinalysis, Routine w reflex microscopic     Status: Abnormal   Collection Time: 05/21/22  1:38 PM  Result Value Ref Range   Color, Urine YELLOW YELLOW   APPearance HAZY (A) CLEAR   Specific Gravity, Urine 1.019 1.005 - 1.030   pH 5.0 5.0 - 8.0   Glucose, UA NEGATIVE NEGATIVE mg/dL   Hgb urine dipstick SMALL (A) NEGATIVE   Bilirubin Urine NEGATIVE NEGATIVE   Ketones, ur NEGATIVE NEGATIVE mg/dL   Protein, ur NEGATIVE NEGATIVE mg/dL   Nitrite NEGATIVE NEGATIVE   Leukocytes,Ua TRACE (A) NEGATIVE   RBC / HPF 0-5 0 - 5 RBC/hpf   WBC, UA 0-5 0 - 5 WBC/hpf   Bacteria, UA MANY (A) NONE SEEN   Squamous Epithelial / LPF 0-5 0 - 5   Mucus PRESENT   CBC with Differential/Platelet     Status: Abnormal   Collection Time: 05/21/22  1:47 PM  Result Value Ref Range   WBC 11.7 (H) 4.0 - 10.5 K/uL   RBC 4.26 3.87 - 5.11 MIL/uL   Hemoglobin 13.5 12.0 - 15.0 g/dL   HCT 05/23/22 05/23/22 - 96.0 %   MCV 92.3 80.0 - 100.0 fL   MCH 31.7 26.0 - 34.0 pg   MCHC 34.4 30.0 - 36.0 g/dL   RDW 45.4 09.8 - 11.9 %   Platelets 263 150 - 400 K/uL   nRBC 0.0 0.0 - 0.2 %    Neutrophils Relative % 77 %   Neutro Abs 8.9 (H) 1.7 - 7.7 K/uL   Lymphocytes Relative 17 %   Lymphs Abs  2.0 0.7 - 4.0 K/uL   Monocytes Relative 6 %   Monocytes Absolute 0.7 0.1 - 1.0 K/uL   Eosinophils Relative 0 %   Eosinophils Absolute 0.0 0.0 - 0.5 K/uL   Basophils Relative 0 %   Basophils Absolute 0.0 0.0 - 0.1 K/uL   Immature Granulocytes 0 %   Abs Immature Granulocytes 0.05 0.00 - 0.07 K/uL  Comprehensive metabolic panel     Status: Abnormal   Collection Time: 05/21/22  1:47 PM  Result Value Ref Range   Sodium 135 135 - 145 mmol/L   Potassium 3.9 3.5 - 5.1 mmol/L   Chloride 106 98 - 111 mmol/L   CO2 19 (L) 22 - 32 mmol/L   Glucose, Bld 97 70 - 99 mg/dL   BUN <5 (L) 6 - 20 mg/dL   Creatinine, Ser 1.54 0.44 - 1.00 mg/dL   Calcium 9.4 8.9 - 00.8 mg/dL   Total Protein 6.9 6.5 - 8.1 g/dL   Albumin 3.5 3.5 - 5.0 g/dL   AST 19 15 - 41 U/L   ALT 12 0 - 44 U/L   Alkaline Phosphatase 68 38 - 126 U/L   Total Bilirubin 0.3 0.3 - 1.2 mg/dL   GFR, Estimated >67 >61 mL/min   Anion gap 10 5 - 15  Amnisure rupture of membrane (rom)not at [facility]     Status: None   Collection Time: 05/21/22  1:47 PM  Result Value Ref Range   Amnisure ROM NEGATIVE   Type and screen [facility]     Status: None   Collection Time: 05/21/22  2:00 PM  Result Value Ref Range   ABO/RH(D) O POS    Antibody Screen NEG    Sample Expiration      05/24/2022,2359 Performed at [facility], [address], [place], [place] [address]    US RENAL  Result Date: 05/21/2022 CLINICAL DATA:  Left flank pain. EXAM: RENAL / URINARY TRACT ULTRASOUND COMPLETE COMPARISON:  None Available. FINDINGS: Right Kidney: Renal measurements: 10.6 x 5.2 x 5.4 cm = volume: 157 mL. Echogenicity within normal limits. No mass or hydronephrosis visualized. Left Kidney: Renal measurements: 13.1 x 6.2 x 4.6 cm = volume: 195 mL. Echogenicity within normal limits. No mass or hydronephrosis visualized. Bladder: Appears  normal for degree of bladder distention. Other: None. IMPRESSION: 1. Normal renal ultrasound. Electronically Signed   By: Obie Dredge M.D.   On: 05/21/2022 16:01    Meds ordered this encounter  Medications   lactated ringers bolus 1,000 mL   morphine (PF) 4 MG/ML injection 1 mg   cyclobenzaprine (FLEXERIL) 10 MG tablet    Sig: Take 1 tablet (10 mg total) by mouth 2 (two) times daily as needed for muscle spasms.    Dispense:  20 tablet    Refill:  0    Order Specific Question:   Supervising Provider    Answer:   Venora Maples [[id number]]   Misc. Devices MISC    Sig: Dispense one maternity belt for patient    Dispense:  1 each    Refill:  0    Order Specific Question:   Supervising Provider    Answer:   Venora Maples [[id number]]   metroNIDAZOLE (METROGEL) 0.75 % vaginal gel    Sig: Place 1 Applicatorful vaginally at bedtime. Apply one applicatorful to vagina at bedtime for 5 days    Dispense:  70 g    Refill:  1    Order  Specific Question:   Supervising Provider    Answer:   Venora Maples [[id number]]   Assessment and Plan  --21 y.o. G1P0 at [redacted]w[redacted]d  --FHT 143 by Doppler --Round ligament pain --New rx maternity belt and Flexeril --Urine culture in process (from office) --Care coordinated with Dr. Crissie Reese  --Discharge home in stable condition  Calvert Cantor, MSA, MSN, CNM 05/21/2022, 8:42 PM

## 2022-05-21 NOTE — MAU Note (Signed)
.  Joann Reid is a 21 y.o. at [redacted]w[redacted]d here in MAU reporting: sent from office with possible kidney infection. Stated she has had sharp pain in her left side and abd  x3 days and pain with urination and some blood in her urine. Reports her underwear has been wet may be leaking fluid.  LMP:  Onset of complaint: 3 days Pain score: 10 Vitals:   05/21/22 1304  BP: 122/73  Pulse: (!) 101  Resp: 18  Temp: 98.5 F (36.9 C)     FHT:143 Lab orders placed from triage:

## 2022-05-21 NOTE — Discharge Instructions (Signed)
Locations that carry Maternity Belts/Belly Bands (03/2022):  [facility]  [address], [place], [place] [address]  [phone]  [facility]  [address] [place], [place] [address]  [phone]  [facility]  [address] [place], [place] [address]  [phone]  [facility]  [address] [place], [place], [place] [address]  [phone]   Round Ligament Pain during Pregnancy Many women will experience a type of pain referred to as "round ligament pain" during their pregnancy. This is associated with abdominal pain or discomfort. Since any type of abdominal pain during pregnancy can be disconcerting, it is important to talk about round ligament pain to relieve any anxiety or fears you may have regarding the symptoms you are feeling. Round ligament pain is due to normal changes that take place in the body during pregnancy. It is caused by stretching of the round ligaments attached to the uterus. More commonly it occurs on the right side of the pelvis. Round Ligament: An Overview Typically in the non-pregnant state the uterus is about the size of an apple or pear. There are thick ligaments which hold the uterus in place in the abdomen, referred to as round ligaments. During pregnancy, your uterus will expand in size and weight, and the ligaments supporting it will have to stretch, becoming longer and thinner. As these ligaments pull and tug they may irritate nearby nerve fibers, which causes pain. The severity of the pain in some cases can seem extreme. Some common symptoms of round ligament pain include:  Ligament spasms or contractions/cramps that trigger a sharp pain typically on the right side of the abdomen.  Pain upon waking or suddenly rolling over in your sleep.  Pain in the abdomen that is sharp brought on by exercise or other vigorous activity. Similar Problems Round ligament pain is often mistaken for other medical conditions because the symptoms  are similar. Acute abdominal pain during pregnancy may also be a sign of other conditions including:  Abdominal cramps - Some abdominal pain is simply caused by change in bowel habits associated with pregnancy. Gas is a common problem that can cause sharp, shooting pain. You should always seek out medical care if your pain is accompanied by fever, chills, pain upon urination or if you have difficulty walking. Further exams and tests will be conducted to ensure that you do not have a more serious condition. It is not uncommon for women with lower abdominal pain to have a urinary tract infection, thus you may also be asked for a urine sample. Treatment If all other conditions are ruled out you can treat your round ligament pain relatively easily. You may be advised to take some acetaminophen (Tylenol) to reduce the severity of any persistent pain and asked to reduce your activity level. You can apply a heating pad to the area of pain or take a warm bath. Lying on the opposite side of the pain may help as well. Most women will find relief from round ligament pain simply by altering their daily routines slightly. The good news is round ligament pain will disappear completely once you have given birth to your child!

## 2022-05-23 LAB — CULTURE, OB URINE

## 2022-05-23 LAB — URINE CULTURE, OB REFLEX

## 2022-05-27 ENCOUNTER — Ambulatory Visit: Payer: Medicaid Other | Attending: Obstetrics & Gynecology

## 2022-05-27 ENCOUNTER — Ambulatory Visit: Payer: Medicaid Other | Admitting: *Deleted

## 2022-05-27 ENCOUNTER — Encounter: Payer: Self-pay | Admitting: *Deleted

## 2022-05-27 ENCOUNTER — Other Ambulatory Visit: Payer: Self-pay | Admitting: *Deleted

## 2022-05-27 VITALS — BP 122/68 | HR 82

## 2022-05-27 DIAGNOSIS — O99212 Obesity complicating pregnancy, second trimester: Secondary | ICD-10-CM | POA: Diagnosis not present

## 2022-05-27 DIAGNOSIS — Z3689 Encounter for other specified antenatal screening: Secondary | ICD-10-CM

## 2022-05-27 DIAGNOSIS — Z363 Encounter for antenatal screening for malformations: Secondary | ICD-10-CM | POA: Diagnosis present

## 2022-05-27 DIAGNOSIS — Z3A17 17 weeks gestation of pregnancy: Secondary | ICD-10-CM | POA: Insufficient documentation

## 2022-05-27 DIAGNOSIS — Z362 Encounter for other antenatal screening follow-up: Secondary | ICD-10-CM

## 2022-05-27 DIAGNOSIS — Z3402 Encounter for supervision of normal first pregnancy, second trimester: Secondary | ICD-10-CM

## 2022-05-27 DIAGNOSIS — O321XX0 Maternal care for breech presentation, not applicable or unspecified: Secondary | ICD-10-CM | POA: Diagnosis not present

## 2022-06-15 ENCOUNTER — Telehealth: Payer: Self-pay | Admitting: General Practice

## 2022-06-15 NOTE — Telephone Encounter (Signed)
Britta Mccreedy from the [facility] called and left message on nurse voicemail line stating they supplied the patient a maternity support belt on 6/22 but they haven't received office notes yet. She states they need office notes in order to bill insurance. Their fax number is [phone]

## 2022-06-19 ENCOUNTER — Ambulatory Visit (INDEPENDENT_AMBULATORY_CARE_PROVIDER_SITE_OTHER): Payer: Medicaid Other | Admitting: Certified Nurse Midwife

## 2022-06-19 ENCOUNTER — Other Ambulatory Visit (HOSPITAL_COMMUNITY)
Admission: RE | Admit: 2022-06-19 | Discharge: 2022-06-19 | Disposition: A | Discharge status: Home / Self Care | Payer: Medicaid Other | Source: Ambulatory Visit | Attending: Certified Nurse Midwife | Admitting: Certified Nurse Midwife

## 2022-06-19 VITALS — BP 120/75 | HR 93 | Wt 214.0 lb

## 2022-06-19 DIAGNOSIS — O99212 Obesity complicating pregnancy, second trimester: Secondary | ICD-10-CM

## 2022-06-19 DIAGNOSIS — Z3A20 20 weeks gestation of pregnancy: Secondary | ICD-10-CM

## 2022-06-19 DIAGNOSIS — N898 Other specified noninflammatory disorders of vagina: Secondary | ICD-10-CM | POA: Insufficient documentation

## 2022-06-19 DIAGNOSIS — O99210 Obesity complicating pregnancy, unspecified trimester: Secondary | ICD-10-CM | POA: Insufficient documentation

## 2022-06-19 DIAGNOSIS — Z3402 Encounter for supervision of normal first pregnancy, second trimester: Secondary | ICD-10-CM
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
## 2022-06-22 LAB — ALPHA FETOPROTEIN, MATERNAL
AFP MoM: 1.17
AFP, Serum: 61.9 ng/mL
Calc'd Gestational Age: 20.6 weeks
Maternal Wt: 214 [lb_av]
Risk for ONTD: 1
Twins-AFP: 1

## 2022-06-22 LAB — CERVICOVAGINAL ANCILLARY ONLY
Bacterial Vaginitis (gardnerella): NEGATIVE
Candida Glabrata: NEGATIVE
Candida Vaginitis: NEGATIVE
Chlamydia: NEGATIVE
Comment: NEGATIVE
Comment: NEGATIVE
Comment: NEGATIVE
Comment: NEGATIVE
Comment: NEGATIVE
Comment: NORMAL
Neisseria Gonorrhea: NEGATIVE
Trichomonas: NEGATIVE

## 2022-06-25 ENCOUNTER — Ambulatory Visit: Payer: Medicaid Other

## 2022-06-29 ENCOUNTER — Ambulatory Visit: Payer: Medicaid Other | Admitting: *Deleted

## 2022-06-29 ENCOUNTER — Ambulatory Visit: Payer: Medicaid Other | Attending: Maternal & Fetal Medicine

## 2022-06-29 ENCOUNTER — Other Ambulatory Visit: Payer: Self-pay | Admitting: *Deleted

## 2022-06-29 DIAGNOSIS — O3662X0 Maternal care for excessive fetal growth, second trimester, not applicable or unspecified: Secondary | ICD-10-CM | POA: Diagnosis not present

## 2022-06-29 DIAGNOSIS — O99212 Obesity complicating pregnancy, second trimester: Secondary | ICD-10-CM | POA: Diagnosis present

## 2022-06-29 DIAGNOSIS — Z362 Encounter for other antenatal screening follow-up: Secondary | ICD-10-CM | POA: Insufficient documentation

## 2022-06-29 DIAGNOSIS — O321XX0 Maternal care for breech presentation, not applicable or unspecified: Secondary | ICD-10-CM

## 2022-06-29 DIAGNOSIS — R638 Other symptoms and signs concerning food and fluid intake: Secondary | ICD-10-CM

## 2022-06-29 DIAGNOSIS — Z3A22 22 weeks gestation of pregnancy: Secondary | ICD-10-CM

## 2022-06-29 DIAGNOSIS — E669 Obesity, unspecified: Secondary | ICD-10-CM | POA: Diagnosis not present

## 2022-07-17 ENCOUNTER — Ambulatory Visit (INDEPENDENT_AMBULATORY_CARE_PROVIDER_SITE_OTHER): Payer: Medicaid Other | Admitting: Obstetrics and Gynecology

## 2022-07-17 VITALS — BP 133/79 | HR 96 | Wt 215.0 lb

## 2022-07-17 DIAGNOSIS — F32A Depression, unspecified: Secondary | ICD-10-CM

## 2022-07-17 DIAGNOSIS — Z3402 Encounter for supervision of normal first pregnancy, second trimester: Secondary | ICD-10-CM

## 2022-07-17 DIAGNOSIS — Z3A24 24 weeks gestation of pregnancy: Secondary | ICD-10-CM

## 2022-07-17 MED ORDER — SERTRALINE HCL 25 MG PO TABS: 25.0000 mg | ORAL_TABLET | Freq: Every day | ORAL | 1 refills | Status: DC

## 2022-07-17 MED ORDER — ASPIRIN 81 MG PO CHEW: 81.0000 mg | CHEWABLE_TABLET | Freq: Every day | ORAL | 1 refills | Status: DC

## 2022-07-17 NOTE — Progress Notes (Signed)
   PRENATAL VISIT NOTE  Subjective:  Joann Reid is a 21 y.o. G1P0 at [redacted]w[redacted]d being seen today for ongoing prenatal care.  She is currently monitored for the following issues for this low-risk pregnancy and has Supervision of normal first pregnancy; Nonintractable episodic headache; Moderate persistent asthma; IBS (irritable bowel syndrome); and Obesity affecting pregnancy on their problem list.  Patient reports some depression. FOB not involved, feels lonely. Concerned about PP depression.  No thoughts of harm to self or others. Contractions: Not present. Vag. Bleeding: None.  Movement: Present. Denies leaking of fluid.   The following portions of the patient's history were reviewed and updated as appropriate: allergies, current medications, past family history, past medical history, past social history, past surgical history and problem list.   Objective:   Vitals:   07/17/22 0903  BP: 133/79  Pulse: 96  Weight: 215 lb (97.5 kg)    Fetal Status: Fetal Heart Rate (bpm): 147 Fundal Height: 25 cm Movement: Present     General:  Alert, oriented and cooperative. Patient is in no acute distress.  Skin: Skin is warm and dry. No rash noted.   Cardiovascular: Normal heart rate noted  Respiratory: Normal respiratory effort, no problems with respiration noted  Abdomen: Soft, gravid, appropriate for gestational age.  Pain/Pressure: Absent     Pelvic: Cervical exam deferred        Extremities: Normal range of motion.  Edema: None  Mental Status: Normal mood and affect. Normal behavior. Normal judgment and thought content.   Assessment and Plan:  Pregnancy: G1P0 at [redacted]w[redacted]d  1. Depression, unspecified depression type  - Ambulatory referral to Integrated Behavioral Health  - Start Zoloft 25 mg PO, will evaluate and increase as needed. Mood check at next visit.   2. Encounter for supervision of normal first pregnancy in second trimester  - BMI greater than 30, Primigravida, will start  BASA. - 2 hour GTT next visit. - Considering BTL vs IUD  Preterm labor symptoms and general obstetric precautions including but not limited to vaginal bleeding, contractions, leaking of fluid and fetal movement were reviewed in detail with the patient. Please refer to After Visit Summary for other counseling recommendations.   Return 3 [place] for glucose test. come fasting..  Future Appointments  Date Time Provider Department Center  08/11/2022 10:30 AM Watson Robarge, Harolyn Rutherford, NP [facility]  08/17/2022  8:15 AM [facility] NURSE [facility]  08/17/2022  8:30 AM [facility] US2 [facility]    Venia Carbon, NP

## 2022-08-10 ENCOUNTER — Encounter: Payer: Medicaid Other | Admitting: Obstetrics & Gynecology

## 2022-08-11 ENCOUNTER — Ambulatory Visit (INDEPENDENT_AMBULATORY_CARE_PROVIDER_SITE_OTHER): Payer: Medicaid Other | Admitting: Licensed Clinical Social Worker

## 2022-08-11 ENCOUNTER — Ambulatory Visit (INDEPENDENT_AMBULATORY_CARE_PROVIDER_SITE_OTHER): Payer: Medicaid Other | Admitting: Obstetrics and Gynecology

## 2022-08-11 VITALS — BP 120/78 | HR 105 | Wt 216.0 lb

## 2022-08-11 DIAGNOSIS — Z3A28 28 weeks gestation of pregnancy: Secondary | ICD-10-CM

## 2022-08-11 DIAGNOSIS — Z23 Encounter for immunization: Secondary | ICD-10-CM

## 2022-08-11 DIAGNOSIS — F32A Depression, unspecified: Secondary | ICD-10-CM | POA: Diagnosis not present

## 2022-08-11 DIAGNOSIS — O99340 Other mental disorders complicating pregnancy, unspecified trimester: Secondary | ICD-10-CM

## 2022-08-11 DIAGNOSIS — O99343 Other mental disorders complicating pregnancy, third trimester: Secondary | ICD-10-CM

## 2022-08-11 DIAGNOSIS — Z3402 Encounter for supervision of normal first pregnancy, second trimester: Secondary | ICD-10-CM

## 2022-08-11 NOTE — Addendum Note (Signed)
Addended by: Kathie Dike on: 08/11/2022 10:00 AM   Modules accepted: Orders

## 2022-08-11 NOTE — Progress Notes (Signed)
   PRENATAL VISIT NOTE  Subjective:  Joann Reid is a 21 y.o. G1P0 at [redacted]w[redacted]d being seen today for ongoing prenatal care.  She is currently monitored for the following issues for this low-risk pregnancy and has Supervision of normal first pregnancy; Nonintractable episodic headache; Moderate persistent asthma; IBS (irritable bowel syndrome); Obesity affecting pregnancy; and Depression affecting pregnancy on their problem list.  Patient reports no complaints.  Contractions: Not present. Vag. Bleeding: None.  Movement: Present. Denies leaking of fluid.   The following portions of the patient's history were reviewed and updated as appropriate: allergies, current medications, past family history, past medical history, past social history, past surgical history and problem list.   Objective:   Vitals:   08/11/22 0917  BP: 120/78  Pulse: (!) 105  Weight: 216 lb (98 kg)    Fetal Status: Fetal Heart Rate (bpm): 157 Fundal Height: 29 cm Movement: Present     General:  Alert, oriented and cooperative. Patient is in no acute distress.  Skin: Skin is warm and dry. No rash noted.   Cardiovascular: Normal heart rate noted  Respiratory: Normal respiratory effort, no problems with respiration noted  Abdomen: Soft, gravid, appropriate for gestational age.  Pain/Pressure: Present     Pelvic: Cervical exam deferred        Extremities: Normal range of motion.  Edema: None  Mental Status: Normal mood and affect. Normal behavior. Normal judgment and thought content.   Assessment and Plan:  Pregnancy: G1P0 at [redacted]w[redacted]d 1. Encounter for supervision of normal first pregnancy in second trimester  - Reports good fetal movement - every 2 week visits going forward.  - 2Hr GTT w/ 1 Hr Carpenter 75 g - CBC - HIV antibody (with reflex) - RPR  2. Depression affecting pregnancy  - Started Zoloft, doing well - Would like to stay at 25 mg daily - She is seeing Sue Lush today with integrated health.  Preterm  labor symptoms and general obstetric precautions including but not limited to vaginal bleeding, contractions, leaking of fluid and fetal movement were reviewed in detail with the patient. Please refer to After Visit Summary for other counseling recommendations.   No follow-ups on file.  Future Appointments  Date Time Provider Department Center  08/11/2022 10:30 AM Sherald Balbuena, Harolyn Rutherford, NP [facility]  08/11/2022  3:45 PM Gwyndolyn Saxon, LCSW [facility] None  08/17/2022  8:15 AM [facility] NURSE [facility]  08/17/2022  8:30 AM [facility] US2 [facility]    Venia Carbon, NP

## 2022-08-12 LAB — CBC
HCT: 35.9 % (ref 35.0–45.0)
Hemoglobin: 12.3 g/dL (ref 11.7–15.5)
MCH: 32.1 pg (ref 27.0–33.0)
MCHC: 34.3 g/dL (ref 32.0–36.0)
MCV: 93.7 fL (ref 80.0–100.0)
MPV: 10.2 fL (ref 7.5–12.5)
Platelets: 221 10*3/uL (ref 140–400)
RBC: 3.83 10*6/uL (ref 3.80–5.10)
RDW: 12.1 % (ref 11.0–15.0)
WBC: 10.7 10*3/uL (ref 3.8–10.8)

## 2022-08-12 LAB — RPR: RPR Ser Ql: NONREACTIVE

## 2022-08-12 LAB — 2HR GTT W 1 HR, CARPENTER, 75 G
Glucose, 1 Hr, Gest: 100 mg/dL (ref 65–179)
Glucose, 2 Hr, Gest: 104 mg/dL (ref 65–152)
Glucose, Fasting, Gest: 76 mg/dL (ref 65–91)

## 2022-08-12 LAB — HIV ANTIBODY (ROUTINE TESTING W REFLEX): HIV 1&2 Ab, 4th Generation: NONREACTIVE

## 2022-08-15 NOTE — BH Specialist Note (Signed)
Integrated Behavioral Health via Telemedicine Visit  08/15/2022 Aalyssa Elderkin [id number]  Number of [place] Clinician visits: 1 Session Start time:  345pm Session End time: 406pm Total time in minutes: 21 mins via mychart video   Referring Provider: Altamease Oiler NP Patient/Family location: Home  [facility] Provider location: [place]  All persons participating in visit: Pt D. Boehner and LCSW A. Dwana Garin Types of Service: Individual psychotherapy and Video visit  I connected with Dianne Dun and/or Ward via  Telephone or Video Enabled Telemedicine Application  (Video is Caregility application) and verified that I am speaking with the correct person using two identifiers. Discussed confidentiality: Yes   I discussed the limitations of telemedicine and the availability of in person appointments.  Discussed there is a possibility of technology failure and discussed alternative modes of communication if that failure occurs.  I discussed that engaging in this telemedicine visit, they consent to the provision of behavioral healthcare and the services will be billed under their insurance.  Patient and/or legal guardian expressed understanding and consented to Telemedicine visit: Yes   Presenting Concerns: Patient and/or family reports the following symptoms/concerns: depressed mood Duration of problem: approx  one month; Severity of problem: mild  Patient and/or Family's Strengths/Protective Factors: Concrete supports in place (healthy food, safe environments, etc.)  Goals Addressed: Patient will:  Reduce symptoms of: depression   Increase knowledge and/or ability of: coping skills   Demonstrate ability to: Increase healthy adjustment to current life circumstances  Progress towards Goals: Ongoing  Interventions: Interventions utilized:  motivational interviewing  Standardized Assessments completed: N/A  Patient and/or Family Response: Ms. Gillin reports  depressed mood. Ms. Kilts denies thoughts of self harm. Ms. Gallaga reports family is supportive. According to Ms. Riddle she is experiencing fatigue, difficulty adjusting to pregnancy, and feeling overwhelmed. Ms. Lashley reports no history of behavioral health dx.   Assessment: Patient currently experiencing depression  Patient may benefit from integrated behavioral health.  Plan: Follow up with behavioral health clinician on : 08/21/2022 Behavioral recommendations: Prioritize rest, intentionally schedule in time for self care and mindfulness, discuss concerns with supportive family and partner for added support.  Referral(s): [place] (In Clinic)  I discussed the assessment and treatment plan with the patient and/or parent/guardian. They were provided an opportunity to ask questions and all were answered. They agreed with the plan and demonstrated an understanding of the instructions.   They were advised to call back or seek an in-person evaluation if the symptoms worsen or if the condition fails to improve as anticipated.  Lynnea Ferrier, LCSW

## 2022-08-17 ENCOUNTER — Other Ambulatory Visit: Payer: Self-pay | Admitting: *Deleted

## 2022-08-17 ENCOUNTER — Ambulatory Visit: Payer: Medicaid Other | Admitting: *Deleted

## 2022-08-17 ENCOUNTER — Ambulatory Visit: Payer: Medicaid Other | Attending: Obstetrics

## 2022-08-17 VITALS — BP 120/66 | HR 96

## 2022-08-17 DIAGNOSIS — Z3A29 29 weeks gestation of pregnancy: Secondary | ICD-10-CM | POA: Diagnosis not present

## 2022-08-17 DIAGNOSIS — Z363 Encounter for antenatal screening for malformations: Secondary | ICD-10-CM | POA: Diagnosis not present

## 2022-08-17 DIAGNOSIS — E669 Obesity, unspecified: Secondary | ICD-10-CM | POA: Diagnosis not present

## 2022-08-17 DIAGNOSIS — R638 Other symptoms and signs concerning food and fluid intake: Secondary | ICD-10-CM | POA: Insufficient documentation

## 2022-08-17 DIAGNOSIS — O99213 Obesity complicating pregnancy, third trimester: Secondary | ICD-10-CM

## 2022-08-21 ENCOUNTER — Institutional Professional Consult (permissible substitution): Payer: Medicaid Other | Admitting: Licensed Clinical Social Worker

## 2022-08-24 ENCOUNTER — Encounter: Payer: Self-pay | Admitting: Obstetrics and Gynecology

## 2022-08-24 ENCOUNTER — Ambulatory Visit (INDEPENDENT_AMBULATORY_CARE_PROVIDER_SITE_OTHER): Payer: Medicaid Other | Admitting: Obstetrics and Gynecology

## 2022-08-24 VITALS — BP 117/80 | HR 91 | Wt 219.0 lb

## 2022-08-24 DIAGNOSIS — Z3402 Encounter for supervision of normal first pregnancy, second trimester: Secondary | ICD-10-CM

## 2022-08-24 DIAGNOSIS — Z3403 Encounter for supervision of normal first pregnancy, third trimester: Secondary | ICD-10-CM

## 2022-08-24 DIAGNOSIS — O99213 Obesity complicating pregnancy, third trimester: Secondary | ICD-10-CM

## 2022-08-24 DIAGNOSIS — Z3A30 30 weeks gestation of pregnancy: Secondary | ICD-10-CM

## 2022-08-24 NOTE — Progress Notes (Signed)
   PRENATAL VISIT NOTE  Subjective:  Joann Reid is a 21 y.o. G1P0 at [redacted]w[redacted]d being seen today for ongoing prenatal care.  She is currently monitored for the following issues for this low-risk pregnancy and has Supervision of normal first pregnancy; Nonintractable episodic headache; Moderate persistent asthma; IBS (irritable bowel syndrome); Obesity affecting pregnancy; and Depression affecting pregnancy on their problem list.  Patient reports  conjunctivitis that has improved with eye drops and a throat infection for which she was given penicillin but was afraid to take it, unsure of diagnosis .  Contractions: Not present. Vag. Bleeding: None.  Movement: Present. Denies leaking of fluid.   The following portions of the patient's history were reviewed and updated as appropriate: allergies, current medications, past family history, past medical history, past social history, past surgical history and problem list.   Objective:   Vitals:   08/24/22 1624  BP: 117/80  Pulse: 91  Weight: 219 lb (99.3 kg)    Fetal Status: Fetal Heart Rate (bpm): 132   Movement: Present     General:  Alert, oriented and cooperative. Patient is in no acute distress.  Skin: Skin is warm and dry. No rash noted.   Cardiovascular: Normal heart rate noted  Respiratory: Normal respiratory effort, no problems with respiration noted  Abdomen: Soft, gravid, appropriate for gestational age.  Pain/Pressure: Absent     Pelvic: Cervical exam deferred        Extremities: Normal range of motion.  Edema: None  Mental Status: Normal mood and affect. Normal behavior. Normal judgment and thought content.   Assessment and Plan:  Pregnancy: G1P0 at [redacted]w[redacted]d 1. Encounter for supervision of normal first pregnancy in second trimester Okay to take penicillin for throat infection  2. Obesity affecting pregnancy in third trimester   Term labor symptoms and general obstetric precautions including but not limited to vaginal bleeding,  contractions, leaking of fluid and fetal movement were reviewed in detail with the patient. Please refer to After Visit Summary for other counseling recommendations.   Return in about 2 weeks (around 09/07/2022) for high OB.  Future Appointments  Date Time Provider [place]  08/25/2022  9:00 AM Lynnea Ferrier, LCSW [facility] None  09/08/2022  8:30 AM Renee Harder, CNM [facility]  09/22/2022  9:10 AM Rolanda Lundborg, Artist Pais, NP [facility]  09/28/2022  8:15 AM [facility] NURSE [facility]  09/28/2022  8:30 AM [facility] US2 [facility]  10/06/2022  8:30 AM Renee Harder, CNM [facility]  10/13/2022  8:30 AM Rasch, Artist Pais, NP [facility]  10/20/2022  8:30 AM Rasch, Artist Pais, NP [facility]  10/27/2022  8:30 AM Rasch, Artist Pais, NP [facility]    Sloan Leiter, MD

## 2022-08-25 ENCOUNTER — Ambulatory Visit (INDEPENDENT_AMBULATORY_CARE_PROVIDER_SITE_OTHER): Payer: Medicaid Other | Admitting: Licensed Clinical Social Worker

## 2022-08-25 ENCOUNTER — Encounter: Payer: Medicaid Other | Admitting: Obstetrics and Gynecology

## 2022-08-25 DIAGNOSIS — O99340 Other mental disorders complicating pregnancy, unspecified trimester: Secondary | ICD-10-CM | POA: Diagnosis not present

## 2022-08-25 DIAGNOSIS — F32A Depression, unspecified: Secondary | ICD-10-CM

## 2022-08-26 NOTE — BH Specialist Note (Signed)
Integrated Behavioral Health Follow Up In-Person Visit  MRN: [id number] Name: Joann Reid  Number of [place] Clinician visits: 2 Session Start time: 9:00am  Session End time: 9:42am Total time in minutes: 42 mins via phone   Types of Service: Individual psychotherapy and Telephone visit  Interpretor:No. Interpretor Name and Language: none  Subjective: [name] is a 22 y.o. female accompanied by n/a Patient was referred by Dr. Rosana Hoes for depression. Patient reports the following symptoms/concerns: depressed mood, feeling on edge, loss of interest, trouble staying asleep, feeling overwhelmed Duration of problem: approx 50months; Severity of problem: mild  Objective: Mood: Depressed and Affect: Appropriate Risk of harm to self or others: No plan to harm self or others  Life Context: Family and Social: Lives in [place] with family School/Work: exploring options Self-Care: n/a Life Changes: new pregnancy  Patient and/or Family's Strengths/Protective Factors: Concrete supports in place (healthy food, safe environments, etc.)  Goals Addressed: Patient will:  Reduce symptoms of: depression   Increase knowledge and/or ability of: coping skills and self-management skills   Demonstrate ability to: Increase healthy adjustment to current life circumstances  Progress towards Goals: Ongoing  Interventions: Interventions utilized:  Motivational Interviewing and Supportive Counseling Standardized Assessments completed: PHQ 9  Patient and/or Family Response: Ms. Zipp reports depressed mood and feeling stuck. Ms. Jester reports an interesting in going back to school however she does not know where to start. Ms. Scroggin reports family is not supportive and relies too much on her for everything.    Assessment: Patient currently experiencing depression affecting pregnancy.   Patient may benefit from integrated behavioral health.  Plan: Follow up with  behavioral health clinician on : 09/15/2022 Behavioral recommendations: create smart goals, discuss school goals with career counselor, prioritize rest, develop boundaries Referral(s): [place] (In Clinic) "From scale of 1-10, how likely are you to follow plan?":    Lynnea Ferrier, LCSW

## 2022-08-31 ENCOUNTER — Telehealth: Payer: Self-pay | Admitting: *Deleted

## 2022-08-31 NOTE — Telephone Encounter (Signed)
Returned call from 2:17 PM, out going phone lines not working properly. Needs to be triaged by a clinical staff member. Patient advised to call the office back and speak with the after hours nurse line for back pain and vaginal pressure.

## 2022-09-08 ENCOUNTER — Ambulatory Visit (INDEPENDENT_AMBULATORY_CARE_PROVIDER_SITE_OTHER): Payer: Medicaid Other

## 2022-09-08 VITALS — BP 120/79 | HR 103 | Wt 223.0 lb

## 2022-09-08 DIAGNOSIS — R300 Dysuria: Secondary | ICD-10-CM

## 2022-09-08 DIAGNOSIS — O26893 Other specified pregnancy related conditions, third trimester: Secondary | ICD-10-CM

## 2022-09-08 DIAGNOSIS — Z3403 Encounter for supervision of normal first pregnancy, third trimester: Secondary | ICD-10-CM

## 2022-09-08 DIAGNOSIS — O26899 Other specified pregnancy related conditions, unspecified trimester: Secondary | ICD-10-CM

## 2022-09-08 DIAGNOSIS — Z3A32 32 weeks gestation of pregnancy: Secondary | ICD-10-CM

## 2022-09-08 DIAGNOSIS — G5600 Carpal tunnel syndrome, unspecified upper limb: Secondary | ICD-10-CM

## 2022-09-08 NOTE — Progress Notes (Signed)
   PRENATAL VISIT NOTE  Subjective:  Joann Reid is a 21 y.o. G1P0 at [redacted]w[redacted]d being seen today for ongoing prenatal care.  She is currently monitored for the following issues for this low-risk pregnancy and has Supervision of normal first pregnancy; Nonintractable episodic headache; Moderate persistent asthma; IBS (irritable bowel syndrome); Obesity affecting pregnancy; and Depression affecting pregnancy on their problem list.  Patient reports pressure with urination and urgency. Denies dysuria or hematuria. Also having tingling/numbness in right hand.  Contractions: Not present. Vag. Bleeding: None.  Movement: Present. Denies leaking of fluid.   The following portions of the patient's history were reviewed and updated as appropriate: allergies, current medications, past family history, past medical history, past social history, past surgical history and problem list.   Objective:   Vitals:   09/08/22 1457  BP: 120/79  Pulse: (!) 103  Weight: 223 lb (101.2 kg)    Fetal Status: Fetal Heart Rate (bpm): 148 Fundal Height: 32 cm Movement: Present     General:  Alert, oriented and cooperative. Patient is in no acute distress.  Skin: Skin is warm and dry. No rash noted.   Cardiovascular: Normal heart rate noted  Respiratory: Normal respiratory effort, no problems with respiration noted  Abdomen: Soft, gravid, appropriate for gestational age.  Pain/Pressure: Absent     Pelvic: Cervical exam deferred        Extremities: Normal range of motion.  Edema: None  Mental Status: Normal mood and affect. Normal behavior. Normal judgment and thought content.   Assessment and Plan:  Pregnancy: G1P0 at [redacted]w[redacted]d 1. Dysuria during pregnancy, antepartum  - Culture, OB Urine  2. Encounter for supervision of normal first pregnancy in third trimester - Routine OB. Doing well - Anticipatory guidance for upcoming appointments provided  3. [redacted] weeks gestation of pregnancy - FH appropriate - Endorses active  fetal movement  4. Carpal tunnel syndrome during pregnancy - Tingling and numbness likely carpal tunnel. Encouraged patient to try braces  Preterm labor symptoms and general obstetric precautions including but not limited to vaginal bleeding, contractions, leaking of fluid and fetal movement were reviewed in detail with the patient. Please refer to After Visit Summary for other counseling recommendations.   Return in about 2 weeks (around 09/22/2022) for LOB.  Future Appointments  Date Time Provider [place]  09/15/2022  9:45 AM Lynnea Ferrier, LCSW [facility] None  09/22/2022  9:10 AM Rasch, Artist Pais, NP [facility]  09/28/2022  8:15 AM [facility] NURSE [facility]  09/28/2022  8:30 AM [facility] US2 [facility]  10/06/2022  8:30 AM Renee Harder, CNM [facility]  10/13/2022  8:30 AM Rasch, Artist Pais, NP [facility]  10/20/2022  8:30 AM Rasch, Artist Pais, NP [facility]  10/27/2022  8:30 AM Rasch, Artist Pais, NP [facility]    Renee Harder, CNM

## 2022-09-08 NOTE — Progress Notes (Signed)
Pt c/o pressure when she urinates

## 2022-09-10 LAB — URINE CULTURE, OB REFLEX

## 2022-09-10 LAB — CULTURE, OB URINE

## 2022-09-15 ENCOUNTER — Encounter: Payer: Medicaid Other | Admitting: Licensed Clinical Social Worker

## 2022-09-22 ENCOUNTER — Ambulatory Visit (INDEPENDENT_AMBULATORY_CARE_PROVIDER_SITE_OTHER): Payer: Medicaid Other | Admitting: Obstetrics and Gynecology

## 2022-09-22 VITALS — BP 119/80 | HR 88 | Wt 226.0 lb

## 2022-09-22 DIAGNOSIS — F32A Depression, unspecified: Secondary | ICD-10-CM

## 2022-09-22 DIAGNOSIS — O99343 Other mental disorders complicating pregnancy, third trimester: Secondary | ICD-10-CM

## 2022-09-22 DIAGNOSIS — Z3403 Encounter for supervision of normal first pregnancy, third trimester: Secondary | ICD-10-CM

## 2022-09-22 DIAGNOSIS — Z3A34 34 weeks gestation of pregnancy: Secondary | ICD-10-CM

## 2022-09-22 DIAGNOSIS — J4540 Moderate persistent asthma, uncomplicated: Secondary | ICD-10-CM

## 2022-09-22 NOTE — Progress Notes (Signed)
   PRENATAL VISIT NOTE  Subjective:  Joann Reid is a 21 y.o. G1P0 at [redacted]w[redacted]d being seen today for ongoing prenatal care.  She is currently monitored for the following issues for this low-risk pregnancy and has Supervision of normal first pregnancy; Nonintractable episodic headache; Moderate persistent asthma; IBS (irritable bowel syndrome); Obesity affecting pregnancy; and Depression affecting pregnancy on their problem list.  Patient reports  skin changes on her lower abdomen. Has been using coconut oil. No itching or burning .  Contractions: Not present. Vag. Bleeding: None.  Movement: Present. Denies leaking of fluid.   The following portions of the patient's history were reviewed and updated as appropriate: allergies, current medications, past family history, past medical history, past social history, past surgical history and problem list.   Objective:   Vitals:   09/22/22 0913  BP: 119/80  Pulse: 88  Weight: 226 lb (102.5 kg)    Fetal Status: Fetal Heart Rate (bpm): 143 Fundal Height: 35 cm Movement: Present     General:  Alert, oriented and cooperative. Patient is in no acute distress.  Skin: Skin is warm and dry. No rash noted.   Cardiovascular: Normal heart rate noted  Respiratory: Normal respiratory effort, no problems with respiration noted  Abdomen: Soft, gravid, appropriate for gestational age.  Pain/Pressure: Present     Pelvic: Cervical exam deferred        Extremities: Normal range of motion.  Edema: None  Mental Status: Normal mood and affect. Normal behavior. Normal judgment and thought content.   Assessment and Plan:  Pregnancy: G1P0 at [redacted]w[redacted]d   1. Encounter for supervision of normal first pregnancy in third trimester  Doing well No complaints Plans Post placental IUD Continue BASA  Keep Your MFM [place] on 10/30 GC and GBS next visit.   2. Depression affecting pregnancy  Stopped taking her Zoloft. Did not like the way it made her feel Interested in  starting something PP  Reports symptoms have improved significantly.   3. Moderate persistent asthma without complication  No issues.   Preterm labor symptoms and general obstetric precautions including but not limited to vaginal bleeding, contractions, leaking of fluid and fetal movement were reviewed in detail with the patient. Please refer to After Visit Summary for other counseling recommendations.   No follow-ups on file.  Future Appointments  Date Time Provider [place]  09/28/2022  8:15 AM [facility] NURSE [facility]  09/28/2022  8:30 AM [facility] US2 [facility]  10/06/2022  8:30 AM Renee Harder, CNM [facility]  10/13/2022  8:30 AM Swayze Pries, Artist Pais, NP [facility]  10/20/2022  8:30 AM Areebah Meinders, Artist Pais, NP [facility]  10/27/2022  8:30 AM Rinaldo Macqueen, Artist Pais, NP [facility]    Noni Saupe, NP

## 2022-09-28 ENCOUNTER — Ambulatory Visit: Payer: Medicaid Other | Attending: Obstetrics

## 2022-09-28 ENCOUNTER — Ambulatory Visit: Payer: Medicaid Other | Admitting: *Deleted

## 2022-09-28 VITALS — BP 128/77 | HR 84

## 2022-09-28 DIAGNOSIS — E669 Obesity, unspecified: Secondary | ICD-10-CM

## 2022-09-28 DIAGNOSIS — Z3A35 35 weeks gestation of pregnancy: Secondary | ICD-10-CM

## 2022-09-28 DIAGNOSIS — O99213 Obesity complicating pregnancy, third trimester: Secondary | ICD-10-CM | POA: Diagnosis present

## 2022-09-28 DIAGNOSIS — Z3689 Encounter for other specified antenatal screening: Secondary | ICD-10-CM | POA: Insufficient documentation

## 2022-10-05 ENCOUNTER — Other Ambulatory Visit (HOSPITAL_COMMUNITY)
Admission: RE | Admit: 2022-10-05 | Discharge: 2022-10-05 | Disposition: A | Discharge status: Home / Self Care | Payer: Medicaid Other | Source: Ambulatory Visit | Attending: Obstetrics & Gynecology | Admitting: Obstetrics & Gynecology

## 2022-10-05 ENCOUNTER — Encounter: Payer: Self-pay | Admitting: Obstetrics & Gynecology

## 2022-10-05 ENCOUNTER — Ambulatory Visit (INDEPENDENT_AMBULATORY_CARE_PROVIDER_SITE_OTHER): Payer: Medicaid Other | Admitting: Obstetrics & Gynecology

## 2022-10-05 VITALS — BP 125/84 | HR 93 | Wt 227.0 lb

## 2022-10-05 DIAGNOSIS — J4540 Moderate persistent asthma, uncomplicated: Secondary | ICD-10-CM

## 2022-10-05 DIAGNOSIS — Z3403 Encounter for supervision of normal first pregnancy, third trimester: Secondary | ICD-10-CM | POA: Diagnosis present

## 2022-10-05 DIAGNOSIS — O99340 Other mental disorders complicating pregnancy, unspecified trimester: Secondary | ICD-10-CM

## 2022-10-05 DIAGNOSIS — O99213 Obesity complicating pregnancy, third trimester: Secondary | ICD-10-CM

## 2022-10-05 DIAGNOSIS — O99343 Other mental disorders complicating pregnancy, third trimester: Secondary | ICD-10-CM

## 2022-10-05 DIAGNOSIS — F32A Depression, unspecified: Secondary | ICD-10-CM

## 2022-10-05 MED ORDER — FLUTICASONE PROPIONATE HFA 110 MCG/ACT IN AERO: 2.0000 | INHALATION_SPRAY | Freq: Two times a day (BID) | RESPIRATORY_TRACT | Status: DC

## 2022-10-05 MED ORDER — BECLOMETHASONE DIPROP HFA 40 MCG/ACT IN AERB: 2.0000 | INHALATION_SPRAY | Freq: Two times a day (BID) | RESPIRATORY_TRACT | 3 refills | Status: DC

## 2022-10-05 NOTE — Addendum Note (Signed)
Addended by: Guss Bunde on: 10/05/2022 02:17 PM   Modules accepted: Orders

## 2022-10-05 NOTE — Progress Notes (Addendum)
   PRENATAL VISIT NOTE  Subjective:  Joann Reid is a 21 y.o. G1P0 at [redacted]w[redacted]d being seen today for ongoing prenatal care.  She is currently monitored for the following issues for this low-risk pregnancy and has Supervision of normal first pregnancy; Nonintractable episodic headache; Moderate persistent asthma; IBS (irritable bowel syndrome); Obesity affecting pregnancy; and Depression affecting pregnancy on their problem list.  Patient reports  using inhaler more often--twice a day .  Contractions: Irritability. Vag. Bleeding: None.  Movement: Present. Denies leaking of fluid.   The following portions of the patient's history were reviewed and updated as appropriate: allergies, current medications, past family history, past medical history, past social history, past surgical history and problem list.   Objective:   Vitals:   10/05/22 0823  BP: 125/84  Pulse: 93  Weight: 227 lb 0.6 oz (103 kg)    Fetal Status: Fetal Heart Rate (bpm): 142   Movement: Present     General:  Alert, oriented and cooperative. Patient is in no acute distress.  Skin: Skin is warm and dry. No rash noted.   Cardiovascular: Normal heart rate noted  Respiratory: Normal respiratory effort, no problems with respiration noted  Abdomen: Soft, gravid, appropriate for gestational age.  Pain/Pressure: Present     Pelvic: Cervical exam deferred        Extremities: Normal range of motion.     Mental Status: Normal mood and affect. Normal behavior. Normal judgment and thought content.   Assessment and Plan:  Pregnancy: G1P0 at [redacted]w[redacted]d 1. Encounter for supervision of normal first pregnancy in third trimester - Strep Gp B NAA - GC/Chlamydia probe amp ([place])not at [facility]  2. Moderate persistent asthma, unspecified whether complicated Qvar not covered by insurance; insurance asked me to change to Flovent  3. Severe obesity due to excess calories affecting pregnancy in third trimester ([place])  4. Depression affecting  pregnancy Off zoloft, no symptoms of depression.  Feels stressed about pregnancy alone.  GAD and PHQ 9 today; would like to speak with Roselyn Reef after pregnancy.    5.  Social Pt is stressed about not having a job.  She does not have a support person identified for labor.  FOB is not involved and her mother does not want to be in the room.  Reached to CNM Walker to get STAT doula.   Term labor symptoms and general obstetric precautions including but not limited to vaginal bleeding, contractions, leaking of fluid and fetal movement were reviewed in detail with the patient. Please refer to After Visit Summary for other counseling recommendations.   No follow-ups on file.  Future Appointments  Date Time Provider [place]  10/05/2022  8:30 AM Guss Bunde, MD [facility]  10/13/2022  8:30 AM Rasch, Artist Pais, NP [facility]  10/20/2022  8:30 AM Rasch, Artist Pais, NP [facility]  10/27/2022  8:30 AM Rasch, Artist Pais, NP [facility]    Silas [place], MD

## 2022-10-06 LAB — GC/CHLAMYDIA PROBE AMP (~~LOC~~) NOT AT ARMC
Chlamydia: NEGATIVE
Comment: NEGATIVE
Comment: NORMAL
Neisseria Gonorrhea: NEGATIVE

## 2022-10-08 LAB — CULTURE, BETA STREP (GROUP B ONLY)
MICRO NUMBER:: 14152894
SPECIMEN QUALITY:: ADEQUATE

## 2022-10-08 LAB — OB RESULTS CONSOLE GBS: GBS: NEGATIVE

## 2022-10-13 ENCOUNTER — Inpatient Hospital Stay (HOSPITAL_BASED_OUTPATIENT_CLINIC_OR_DEPARTMENT_OTHER): Payer: Medicaid Other

## 2022-10-13 ENCOUNTER — Other Ambulatory Visit: Payer: Self-pay

## 2022-10-13 ENCOUNTER — Encounter (HOSPITAL_COMMUNITY): Payer: Self-pay | Admitting: Family Medicine

## 2022-10-13 ENCOUNTER — Ambulatory Visit (INDEPENDENT_AMBULATORY_CARE_PROVIDER_SITE_OTHER): Payer: Medicaid Other | Admitting: Obstetrics and Gynecology

## 2022-10-13 ENCOUNTER — Inpatient Hospital Stay (HOSPITAL_COMMUNITY)
Admission: AD | Admit: 2022-10-13 | Discharge: 2022-10-13 | Disposition: A | Discharge status: Home / Self Care | Payer: Medicaid Other | Attending: Family Medicine | Admitting: Family Medicine

## 2022-10-13 VITALS — BP 129/86 | HR 86 | Wt 230.0 lb

## 2022-10-13 DIAGNOSIS — O212 Late vomiting of pregnancy: Secondary | ICD-10-CM | POA: Insufficient documentation

## 2022-10-13 DIAGNOSIS — M7989 Other specified soft tissue disorders: Secondary | ICD-10-CM

## 2022-10-13 DIAGNOSIS — O368130 Decreased fetal movements, third trimester, not applicable or unspecified: Secondary | ICD-10-CM | POA: Diagnosis present

## 2022-10-13 DIAGNOSIS — O471 False labor at or after 37 completed weeks of gestation: Secondary | ICD-10-CM | POA: Insufficient documentation

## 2022-10-13 DIAGNOSIS — O113 Pre-existing hypertension with pre-eclampsia, third trimester: Secondary | ICD-10-CM | POA: Diagnosis not present

## 2022-10-13 DIAGNOSIS — O26893 Other specified pregnancy related conditions, third trimester: Secondary | ICD-10-CM | POA: Diagnosis not present

## 2022-10-13 DIAGNOSIS — O99213 Obesity complicating pregnancy, third trimester: Secondary | ICD-10-CM | POA: Diagnosis not present

## 2022-10-13 DIAGNOSIS — Z3A37 37 weeks gestation of pregnancy: Secondary | ICD-10-CM | POA: Insufficient documentation

## 2022-10-13 DIAGNOSIS — O10913 Unspecified pre-existing hypertension complicating pregnancy, third trimester: Secondary | ICD-10-CM | POA: Diagnosis not present

## 2022-10-13 DIAGNOSIS — Z3689 Encounter for other specified antenatal screening: Secondary | ICD-10-CM

## 2022-10-13 DIAGNOSIS — O99513 Diseases of the respiratory system complicating pregnancy, third trimester: Secondary | ICD-10-CM | POA: Insufficient documentation

## 2022-10-13 DIAGNOSIS — R5383 Other fatigue: Secondary | ICD-10-CM | POA: Insufficient documentation

## 2022-10-13 DIAGNOSIS — Z3403 Encounter for supervision of normal first pregnancy, third trimester: Secondary | ICD-10-CM

## 2022-10-13 DIAGNOSIS — Z3493 Encounter for supervision of normal pregnancy, unspecified, third trimester: Secondary | ICD-10-CM

## 2022-10-13 HISTORY — DX: Unspecified asthma, uncomplicated: J45.909

## 2022-10-13 LAB — URINALYSIS, ROUTINE W REFLEX MICROSCOPIC
Bilirubin Urine: NEGATIVE
Glucose, UA: NEGATIVE mg/dL
Hgb urine dipstick: NEGATIVE
Ketones, ur: NEGATIVE mg/dL
Nitrite: NEGATIVE
Protein, ur: NEGATIVE mg/dL
Specific Gravity, Urine: 1.004 — ABNORMAL LOW (ref 1.005–1.030)
pH: 7 (ref 5.0–8.0)

## 2022-10-13 LAB — AMNISURE RUPTURE OF MEMBRANE (ROM) NOT AT ARMC: Amnisure ROM: NEGATIVE

## 2022-10-13 LAB — POCT FERN TEST: POCT Fern Test: NEGATIVE

## 2022-10-13 MED ORDER — CYCLOBENZAPRINE HCL 5 MG PO TABS
5.0000 mg | ORAL_TABLET | Freq: Once | ORAL | Status: AC
  Administered 2022-10-13: 5 mg via ORAL
  Filled 2022-10-13: qty 1

## 2022-10-13 MED ORDER — CYCLOBENZAPRINE HCL 5 MG PO TABS
10.0000 mg | ORAL_TABLET | Freq: Once | ORAL | Status: AC
  Administered 2022-10-13: 10 mg via ORAL
  Filled 2022-10-13: qty 2

## 2022-10-13 MED ORDER — ONDANSETRON HCL 4 MG PO TABS: 4.0000 mg | ORAL_TABLET | Freq: Three times a day (TID) | ORAL | 1 refills | Status: DC | PRN

## 2022-10-13 MED ORDER — CYCLOBENZAPRINE HCL 10 MG PO TABS: 10.0000 mg | ORAL_TABLET | Freq: Two times a day (BID) | ORAL | 1 refills | Status: DC | PRN

## 2022-10-13 NOTE — Progress Notes (Signed)
Pt states she vomited and one of the Flexeril tablets previously administered was noticed in emesis.

## 2022-10-13 NOTE — Progress Notes (Signed)
   PRENATAL VISIT NOTE  Subjective:  Joann Reid is a 21 y.o. G1P0 at [redacted]w[redacted]d being seen today for ongoing prenatal care.  She is currently monitored for the following issues for this low-risk pregnancy and has Supervision of normal first pregnancy; Nonintractable episodic headache; Moderate persistent asthma; IBS (irritable bowel syndrome); Obesity affecting pregnancy; and Depression affecting pregnancy on their problem list.  Patient reports contractions since Friday, off and on, headache, nausea, and vomiting.  Contractions: Irregular. Vag. Bleeding: None.  Movement: (!) Decreased. Denies leaking of fluid.   The following portions of the patient's history were reviewed and updated as appropriate: allergies, current medications, past family history, past medical history, past social history, past surgical history and problem list.   Objective:   Vitals:   10/13/22 0848  BP: 129/86  Pulse: 86  Weight: 230 lb (104.3 kg)    Fetal Status:     Movement: (!) Decreased     General:  Alert, oriented and cooperative. Patient is in no acute distress.  Skin: Skin is warm and dry. No rash noted.   Cardiovascular: Normal heart rate noted  Respiratory: Normal respiratory effort, no problems with respiration noted  Abdomen: Soft, gravid, appropriate for gestational age.  Pain/Pressure: Absent     Pelvic: Cervical exam deferred        Extremities: Normal range of motion.  Edema: Trace  Mental Status: Normal mood and affect. Normal behavior. Normal judgment and thought content.   Assessment and Plan:  Pregnancy: G1P0 at [redacted]w[redacted]d  1. Encounter for supervision of normal first pregnancy in third trimester  She looks sickly and ill today in the office. +Vomiting in the office Zofran given 4 mg ODT Decreased fetal movement, + fetal heart tones. Difficult to trace NST as patient is vomiting. Reassuring tracing and doppler Reports HA since Friday. BP is more elevated than her usual BP. No visual  changes.  Given all of the concerns and complaints with vomiting and DFM, I recommend she go to MAU for further evaluation and possibly IVF's . Notified S.Weinhold CNM of patient coming to MAU.  Rielynn is calling her mom now to escort her to MAU.  Return in 1 week for OB f/u Strict Pre E precautions reviewed.    Term labor symptoms and general obstetric precautions including but not limited to vaginal bleeding, contractions, leaking of fluid and fetal movement were reviewed in detail with the patient. Please refer to After Visit Summary for other counseling recommendations.   No follow-ups on file.  Future Appointments  Date Time Provider Department Center  10/20/2022  8:30 AM Arilynn Blakeney, Harolyn Rutherford, NP [facility]  10/27/2022  8:30 AM Angeles Paolucci, Harolyn Rutherford, NP [facility]    Venia Carbon, NP

## 2022-10-13 NOTE — Progress Notes (Signed)
BLE venous duplex has been completed.  Preliminary results given to Clayton Bibles, CNM.   Results can be found under chart review under CV PROC. 10/13/2022 1:47 PM Aleigh Grunden RVT, RDMS

## 2022-10-13 NOTE — Discharge Instructions (Signed)

## 2022-10-13 NOTE — Progress Notes (Signed)
Pt c/o decreased fetal movement, swelling in right leg and frequent headaches

## 2022-10-13 NOTE — MAU Note (Signed)
Joann Reid is a 21 y.o. at [redacted]w[redacted]d here in MAU reporting: was sent over from the office. States she has been vomiting for the past 2 days. States BP is borderline elevated and has swelling on the right side of her body. Currently has a headache. No bleeding. Leaking clear fluid for the past 2 weeks, states was not taken seriously. DFM. Also having contractions.  Onset of complaint: ongoing  Pain score: 7/10  Vitals:   10/13/22 1131  BP: 130/77  Pulse: 78  Resp: 16  Temp: 97.9 F (36.6 C)  SpO2: 100%     FHT:120  Lab orders placed from triage: UA

## 2022-10-13 NOTE — MAU Provider Note (Signed)
History     CSN: [id number]  Arrival date and time: 10/13/22 1117   Event Date/Time   First Provider Initiated Contact with Patient 10/13/22 1352      Chief Complaint  Patient presents with   Headache   Decreased Fetal Movement   Contractions   HPI Joann Reid is a 21 y.o. G1P0 at 4359w1d who [facility] of vomiting and fatigue per report from FNP. On arrival to MAU she endorses multiple additional concerns:  DFM This is a new problem, onset today. Patient reports feeling movement on arrival to MAU  Contractions This is a new problem, uncertain onset. Patient reports abdominal pain with pain score of 7/10. She denies recent cervical exams.  Leaking fluid x 2 weeks This is a recurrent problem. Patient states she has mentioned it but has not had a formal evaluation.   Right side swelling Patient endorses gross swelling on the right side of her body. Swelling improves marginally with elevated of affected extremity but never completely resolves.  Headache P:atient endorses anterior headache with pain score 7/10. She has not taken medication for this complaint. She denies visual disturbances, RUQ/epigastric pain, new onset swelling or weight gain.  Vomiting Patient reports vomiting for the past two days. She denies aggravating or alleviating factors.  HTN Patient states she was told in the office that she had Preeclampsia but the diagnosis was not written in the visit note and not added to her problem list. She denies history of HTN.   OB History     Gravida  1   Para      Term      Preterm      AB      Living         SAB      IAB      Ectopic      Multiple      Live Births              Past Medical History:  Diagnosis Date   Asthma     Past Surgical History:  Procedure Laterality Date   EYE SURGERY      Family History  Problem Relation Age of Onset   Hypertension Mother    Diabetes Maternal Grandmother      Social History   Tobacco Use   Smoking status: Never   Smokeless tobacco: Never  Vaping Use   Vaping Use: Never used  Substance Use Topics   Alcohol use: Never   Drug use: Never    Allergies:  Allergies  Allergen Reactions   Hydrocodone     Facility-Administered Medications Prior to Admission  Medication Dose Route Frequency Provider Last Rate Last Admin   fluticasone (FLOVENT HFA) 110 MCG/ACT inhaler 2 puff  2 puff Inhalation BID Lesly DukesLeggett, Kelly H, MD       polyethylene glycol powder (GLYCOLAX/MIRALAX) container 255 g  1 Container Oral Once Lesly DukesLeggett, Kelly H, MD       Medications Prior to Admission  Medication Sig Dispense Refill Last Dose   Prenatal Vit-Fe Fumarate-FA (MULTIVITAMIN-PRENATAL) 27-0.8 MG TABS tablet Take 1 tablet by mouth daily at 12 noon.   10/12/2022 at 0900   aspirin 81 MG chewable tablet Chew 1 tablet (81 mg total) by mouth daily. (Patient not taking: Reported on 09/28/2022) 90 tablet 1    Blood Pressure Monitoring (BLOOD PRESSURE CUFF) MISC 1 Device by Does not apply route as needed. (Patient not taking: Reported on 07/17/2022)  1 each 0    Misc. Devices MISC Dispense one maternity belt for patient (Patient not taking: Reported on 07/17/2022) 1 each 0    [DISCONTINUED] cyclobenzaprine (FLEXERIL) 10 MG tablet Take 1 tablet (10 mg total) by mouth 2 (two) times daily as needed for muscle spasms. (Patient not taking: Reported on 06/29/2022) 20 tablet 0    [DISCONTINUED] ondansetron (ZOFRAN) 4 MG tablet Take 1 tablet (4 mg total) by mouth every 8 (eight) hours as needed for nausea or vomiting. (Patient not taking: Reported on 07/17/2022) 20 tablet 0     Review of Systems  Gastrointestinal:  Positive for vomiting.  Genitourinary:  Positive for vaginal discharge.  Musculoskeletal:  Positive for joint swelling.  Neurological:  Positive for headaches.  All other systems reviewed and are negative.  Physical Exam   Blood pressure 120/74, pulse 76, temperature 97.9  F (36.6 C), resp. rate 16, height 5\' 5"  (1.651 m), weight 104.6 kg, last menstrual period 01/14/2022, SpO2 100 %.  Physical Exam  MAU Course  Procedures  MDM Orders Placed This Encounter  Procedures   Urinalysis, Routine w reflex microscopic Urine, Clean Catch   Amnisure rupture of membrane (rom)not at [facility] Test   Discharge patient   VAS [facility] LOWER EXTREMITY VENOUS (DVT) (ONLY [facility] & [facility])   Patient Vitals for the past 24 hrs:  BP Temp Pulse Resp SpO2 Height Weight  10/13/22 1341 -- -- -- -- 100 % -- --  10/13/22 1337 120/74 -- 76 -- -- -- --  10/13/22 1300 129/76 -- 83 -- 99 % -- --  10/13/22 1245 134/72 -- 81 -- 98 % -- --  10/13/22 1240 -- -- -- -- 99 % -- --  10/13/22 1230 131/81 -- 75 -- 100 % -- --  10/13/22 1227 -- -- -- -- 100 % -- --  10/13/22 1215 120/80 -- 89 -- -- -- --  10/13/22 1149 121/65 -- 80 -- 100 % -- --  10/13/22 1131 130/77 97.9 F (36.6 C) 78 16 100 % -- --  10/13/22 1126 -- -- -- -- -- 5\' 5"  (1.651 m) 104.6 kg   Results for orders placed or performed during the hospital encounter of 10/13/22 (from the past 24 hour(s))  Urinalysis, Routine w reflex microscopic Urine, Clean Catch     Status: Abnormal   Collection Time: 10/13/22 11:59 AM  Result Value Ref Range   Color, Urine YELLOW YELLOW   APPearance CLEAR CLEAR   Specific Gravity, Urine 1.004 (L) 1.005 - 1.030   pH 7.0 5.0 - 8.0   Glucose, UA NEGATIVE NEGATIVE mg/dL   Hgb urine dipstick NEGATIVE NEGATIVE   Bilirubin Urine NEGATIVE NEGATIVE   Ketones, ur NEGATIVE NEGATIVE mg/dL   Protein, ur NEGATIVE NEGATIVE mg/dL   Nitrite NEGATIVE NEGATIVE   Leukocytes,Ua TRACE (A) NEGATIVE   RBC / HPF 0-5 0 - 5 RBC/hpf   WBC, UA 0-5 0 - 5 WBC/hpf   Bacteria, UA FEW (A) NONE SEEN   Squamous Epithelial / LPF 0-5 0 - 5  Amnisure rupture of membrane (rom)not at [facility]     Status: None   Collection Time: 10/13/22  1:18 PM  Result Value Ref Range   Amnisure ROM NEGATIVE   Fern Test     Status: None    Collection Time: 10/13/22  1:57 PM  Result Value Ref Range   POCT Fern Test Negative = intact amniotic membranes    VAS 10/15/22 LOWER EXTREMITY VENOUS (DVT) (ONLY [facility] &  [facility])  Result Date: 10/13/2022  Lower Venous DVT Study Patient Name:  Joann Reid  Date of Exam:   10/13/2022 Medical Rec #: [id number]       Accession #:    [id number] Date of Birth: 12-25-2000       Patient Gender: F Patient Age:   21 years Exam Location:  [facility] Procedure:      VAS [place] LOWER EXTREMITY VENOUS (DVT) Referring Phys: Clayton Bibles --------------------------------------------------------------------------------  Indications: Swelling.  Risk Factors: Pregnancy. Comparison Study: No previous exams Performing Technologist: Jody Hill RVT, RDMS  Examination Guidelines: A complete evaluation includes B-mode imaging, spectral Doppler, color Doppler, and power Doppler as needed of all accessible portions of each vessel. Bilateral testing is considered an integral part of a complete examination. Limited examinations for reoccurring indications may be performed as noted. The reflux portion of the exam is performed with the patient in reverse Trendelenburg.  +---------+---------------+---------+-----------+----------+--------------+ RIGHT    CompressibilityPhasicitySpontaneityPropertiesThrombus Aging +---------+---------------+---------+-----------+----------+--------------+ CFV      Full           Yes      Yes                                 +---------+---------------+---------+-----------+----------+--------------+ SFJ      Full                                                        +---------+---------------+---------+-----------+----------+--------------+ FV Prox  Full           Yes      Yes                                 +---------+---------------+---------+-----------+----------+--------------+ FV Mid   Full           Yes      Yes                                  +---------+---------------+---------+-----------+----------+--------------+ FV DistalFull           Yes      Yes                                 +---------+---------------+---------+-----------+----------+--------------+ PFV      Full                                                        +---------+---------------+---------+-----------+----------+--------------+ POP      Full           Yes      Yes                                 +---------+---------------+---------+-----------+----------+--------------+ PTV      Full                                                        +---------+---------------+---------+-----------+----------+--------------+  PERO     Full                                                        +---------+---------------+---------+-----------+----------+--------------+   +---------+---------------+---------+-----------+----------+--------------+ LEFT     CompressibilityPhasicitySpontaneityPropertiesThrombus Aging +---------+---------------+---------+-----------+----------+--------------+ CFV      Full           Yes      Yes                                 +---------+---------------+---------+-----------+----------+--------------+ SFJ      Full                                                        +---------+---------------+---------+-----------+----------+--------------+ FV Prox  Full           Yes      Yes                                 +---------+---------------+---------+-----------+----------+--------------+ FV Mid   Full           Yes      Yes                                 +---------+---------------+---------+-----------+----------+--------------+ FV DistalFull           Yes      Yes                                 +---------+---------------+---------+-----------+----------+--------------+ PFV      Full                                                         +---------+---------------+---------+-----------+----------+--------------+ POP      Full           Yes      Yes                                 +---------+---------------+---------+-----------+----------+--------------+ PTV      Full                                                        +---------+---------------+---------+-----------+----------+--------------+ PERO     Full                                                        +---------+---------------+---------+-----------+----------+--------------+  Summary: BILATERAL: - No evidence of deep vein thrombosis seen in the lower extremities, bilaterally. -No evidence of popliteal cyst, bilaterally.   *See table(s) above for measurements and observations.    Preliminary     Assessment and Plan  --21 y.o. G1P0 at [redacted]w[redacted]d  --Reactive tracing --Membranes intact --Negative for DVT --Headache resolved with Flexeril --Refills added to existing prescriptions for Flexeril and Zofran --Patient declines recheck of cervix prior to discharge --Discharge home in stable condition  Calvert Cantor, MSA, MSN, CNM 10/13/2022, 5:30 PM

## 2022-10-20 ENCOUNTER — Ambulatory Visit (INDEPENDENT_AMBULATORY_CARE_PROVIDER_SITE_OTHER): Payer: Medicaid Other | Admitting: Obstetrics and Gynecology

## 2022-10-20 VITALS — BP 138/89 | HR 96 | Wt 232.0 lb

## 2022-10-20 DIAGNOSIS — Z3403 Encounter for supervision of normal first pregnancy, third trimester: Secondary | ICD-10-CM

## 2022-10-20 DIAGNOSIS — Z3A38 38 weeks gestation of pregnancy: Secondary | ICD-10-CM

## 2022-10-20 DIAGNOSIS — R030 Elevated blood-pressure reading, without diagnosis of hypertension: Secondary | ICD-10-CM

## 2022-10-20 MED ORDER — TRANSDERM-SCOP 1 MG/3DAYS TD PT72: 1.0000 | MEDICATED_PATCH | TRANSDERMAL | 1 refills | Status: DC

## 2022-10-20 NOTE — Progress Notes (Signed)
   PRENATAL VISIT NOTE  Subjective:  Joann Reid is a 21 y.o. G1P0 at [redacted]w[redacted]d being seen today for ongoing prenatal care.  She is currently monitored for the following issues for this low-risk pregnancy and has Supervision of normal first pregnancy; Nonintractable episodic headache; Moderate persistent asthma; IBS (irritable bowel syndrome); Obesity affecting pregnancy; and Depression affecting pregnancy on their problem list.  Patient reports vomiting.  Contractions: Not present. Vag. Bleeding: None.  Movement: Present. Denies leaking of fluid.   The following portions of the patient's history were reviewed and updated as appropriate: allergies, current medications, past family history, past medical history, past social history, past surgical history and problem list.   Objective:   Vitals:   10/20/22 0835  BP: 138/89  Pulse: 96  Weight: 232 lb (105.2 kg)    Fetal Status: Fetal Heart Rate (bpm): 143   Movement: Present     General:  Alert, oriented and cooperative. Patient is in no acute distress.  Skin: Skin is warm and dry. No rash noted.   Cardiovascular: Normal heart rate noted  Respiratory: Normal respiratory effort, no problems with respiration noted  Abdomen: Soft, gravid, appropriate for gestational age.  Pain/Pressure: Absent     Pelvic: Cervical exam deferred        Extremities: Normal range of motion.  Edema: Mild pitting, slight indentation  Mental Status: Normal mood and affect. Normal behavior. Normal judgment and thought content.   Assessment and Plan:  Pregnancy: G1P0 at [redacted]w[redacted]d   1. Borderline hypertension  - Protein / creatinine ratio, urine - CBC,CMP  2. Encounter for supervision of normal first pregnancy in third trimester  Patient is actively vomiting in the office; this is not a new problem.She has Zofran at home but does not feel like it is working well.  Rx: Scopolamine patch.  She is staying hydrated at home.  + swelling bilaterally. If symptoms  persist to 39 weeks, will schedule induction at her next visit.   Term labor symptoms and general obstetric precautions including but not limited to vaginal bleeding, contractions, leaking of fluid and fetal movement were reviewed in detail with the patient. Please refer to After Visit Summary for other counseling recommendations.   No follow-ups on file.  Future Appointments  Date Time Provider Department Center  10/27/2022  8:30 AM Marven Veley, Harolyn Rutherford, NP [facility]    Venia Carbon, NP

## 2022-10-21 LAB — PROTEIN / CREATININE RATIO, URINE
Creatinine, Urine: 63 mg/dL (ref 20–275)
Protein/Creat Ratio: 143 mg/g creat (ref 24–184)
Protein/Creatinine Ratio: 0.143 mg/mg creat (ref 0.024–0.184)
Total Protein, Urine: 9 mg/dL (ref 5–24)

## 2022-10-21 LAB — CBC
HCT: 36 % (ref 35.0–45.0)
Hemoglobin: 12.1 g/dL (ref 11.7–15.5)
MCH: 31.5 pg (ref 27.0–33.0)
MCHC: 33.6 g/dL (ref 32.0–36.0)
MCV: 93.8 fL (ref 80.0–100.0)
MPV: 11.5 fL (ref 7.5–12.5)
Platelets: 192 Thousand/uL (ref 140–400)
RBC: 3.84 Million/uL (ref 3.80–5.10)
RDW: 13.1 % (ref 11.0–15.0)
WBC: 9 Thousand/uL (ref 3.8–10.8)

## 2022-10-21 LAB — COMPREHENSIVE METABOLIC PANEL WITH GFR
AG Ratio: 1.2 (calc) (ref 1.0–2.5)
ALT: 18 U/L (ref 6–29)
AST: 50 U/L — ABNORMAL HIGH (ref 10–30)
Albumin: 3.4 g/dL — ABNORMAL LOW (ref 3.6–5.1)
Alkaline phosphatase (APISO): 143 U/L — ABNORMAL HIGH (ref 31–125)
BUN/Creatinine Ratio: 6 (calc) (ref 6–22)
BUN: 4 mg/dL — ABNORMAL LOW (ref 7–25)
CO2: 28 mmol/L (ref 20–32)
Calcium: 9.1 mg/dL (ref 8.6–10.2)
Chloride: 105 mmol/L (ref 98–110)
Creat: 0.69 mg/dL (ref 0.50–0.96)
Globulin: 2.9 g/dL (ref 1.9–3.7)
Glucose, Bld: 86 mg/dL (ref 65–99)
Potassium: 3.7 mmol/L (ref 3.5–5.3)
Sodium: 142 mmol/L (ref 135–146)
Total Bilirubin: 0.5 mg/dL (ref 0.2–1.2)
Total Protein: 6.3 g/dL (ref 6.1–8.1)

## 2022-10-23 ENCOUNTER — Inpatient Hospital Stay (HOSPITAL_COMMUNITY)
Admission: AD | Admit: 2022-10-23 | Discharge: 2022-10-26 | DRG: 807 | Disposition: A | Discharge status: Home / Self Care | Payer: Medicaid Other | Attending: Family Medicine | Admitting: Family Medicine

## 2022-10-23 ENCOUNTER — Encounter (HOSPITAL_COMMUNITY): Payer: Self-pay | Admitting: Obstetrics and Gynecology

## 2022-10-23 DIAGNOSIS — Z30430 Encounter for insertion of intrauterine contraceptive device: Secondary | ICD-10-CM

## 2022-10-23 DIAGNOSIS — O99340 Other mental disorders complicating pregnancy, unspecified trimester: Secondary | ICD-10-CM | POA: Diagnosis present

## 2022-10-23 DIAGNOSIS — O99284 Endocrine, nutritional and metabolic diseases complicating childbirth: Secondary | ICD-10-CM | POA: Diagnosis present

## 2022-10-23 DIAGNOSIS — O134 Gestational [pregnancy-induced] hypertension without significant proteinuria, complicating childbirth: Secondary | ICD-10-CM | POA: Diagnosis present

## 2022-10-23 DIAGNOSIS — J4540 Moderate persistent asthma, uncomplicated: Secondary | ICD-10-CM | POA: Diagnosis present

## 2022-10-23 DIAGNOSIS — O99344 Other mental disorders complicating childbirth: Secondary | ICD-10-CM | POA: Diagnosis present

## 2022-10-23 DIAGNOSIS — Z3400 Encounter for supervision of normal first pregnancy, unspecified trimester: Secondary | ICD-10-CM

## 2022-10-23 DIAGNOSIS — E876 Hypokalemia: Secondary | ICD-10-CM | POA: Diagnosis present

## 2022-10-23 DIAGNOSIS — Z3A38 38 weeks gestation of pregnancy: Secondary | ICD-10-CM | POA: Diagnosis not present

## 2022-10-23 DIAGNOSIS — O99210 Obesity complicating pregnancy, unspecified trimester: Secondary | ICD-10-CM | POA: Diagnosis present

## 2022-10-23 DIAGNOSIS — O9952 Diseases of the respiratory system complicating childbirth: Secondary | ICD-10-CM | POA: Diagnosis present

## 2022-10-23 DIAGNOSIS — O99214 Obesity complicating childbirth: Secondary | ICD-10-CM | POA: Diagnosis present

## 2022-10-23 DIAGNOSIS — F32A Depression, unspecified: Secondary | ICD-10-CM | POA: Diagnosis present

## 2022-10-23 DIAGNOSIS — Z975 Presence of (intrauterine) contraceptive device: Secondary | ICD-10-CM

## 2022-10-23 LAB — URINALYSIS, ROUTINE W REFLEX MICROSCOPIC
Bilirubin Urine: NEGATIVE
Glucose, UA: NEGATIVE mg/dL
Hgb urine dipstick: NEGATIVE
Ketones, ur: NEGATIVE mg/dL
Leukocytes,Ua: NEGATIVE
Nitrite: NEGATIVE
Protein, ur: NEGATIVE mg/dL
Specific Gravity, Urine: 1.003 — ABNORMAL LOW (ref 1.005–1.030)
pH: 7 (ref 5.0–8.0)

## 2022-10-23 LAB — COMPREHENSIVE METABOLIC PANEL
ALT: 24 U/L (ref 0–44)
AST: 53 U/L — ABNORMAL HIGH (ref 15–41)
Albumin: 3.1 g/dL — ABNORMAL LOW (ref 3.5–5.0)
Alkaline Phosphatase: 148 U/L — ABNORMAL HIGH (ref 38–126)
Anion gap: 13 (ref 5–15)
BUN: <5 mg/dL — ABNORMAL LOW (ref 6–20)
CO2: 24 mmol/L (ref 22–32)
Calcium: 8.9 mg/dL (ref 8.9–10.3)
Chloride: 101 mmol/L (ref 98–111)
Creatinine, Ser: 0.76 mg/dL (ref 0.44–1.00)
GFR, Estimated: >60 mL/min (ref >60)
Glucose, Bld: 82 mg/dL (ref 70–99)
Potassium: 2.9 mmol/L — ABNORMAL LOW (ref 3.5–5.1)
Sodium: 138 mmol/L (ref 135–145)
Total Bilirubin: 0.9 mg/dL (ref 0.3–1.2)
Total Protein: 6.8 g/dL (ref 6.5–8.1)

## 2022-10-23 LAB — CBC
HCT: 34.6 % — ABNORMAL LOW (ref 36.0–46.0)
Hemoglobin: 11.9 g/dL — ABNORMAL LOW (ref 12.0–15.0)
MCH: 31.2 pg (ref 26.0–34.0)
MCHC: 34.4 g/dL (ref 30.0–36.0)
MCV: 90.6 fL (ref 80.0–100.0)
Platelets: 198 10*3/uL (ref 150–400)
RBC: 3.82 MIL/uL — ABNORMAL LOW (ref 3.87–5.11)
RDW: 12.7 % (ref 11.5–15.5)
WBC: 11 10*3/uL — ABNORMAL HIGH (ref 4.0–10.5)
nRBC: 0 % (ref 0.0–0.2)

## 2022-10-23 LAB — TYPE AND SCREEN
ABO/RH(D): O POS
Antibody Screen: NEGATIVE

## 2022-10-23 LAB — PROTEIN / CREATININE RATIO, URINE
Creatinine, Urine: 44 mg/dL
Total Protein, Urine: <6 mg/dL

## 2022-10-23 MED ORDER — OXYCODONE-ACETAMINOPHEN 5-325 MG PO TABS: 1.0000 | ORAL_TABLET | ORAL | Status: DC | PRN

## 2022-10-23 MED ORDER — SODIUM CHLORIDE 0.9 % IV SOLN: 25.0000 mg | Freq: Four times a day (QID) | INTRAVENOUS | Status: DC | PRN

## 2022-10-23 MED ORDER — ACETAMINOPHEN 325 MG PO TABS: 650.0000 mg | ORAL_TABLET | ORAL | Status: DC | PRN

## 2022-10-23 MED ORDER — OXYTOCIN BOLUS FROM INFUSION
333.0000 mL | Freq: Once | INTRAVENOUS | Status: AC
  Administered 2022-10-25: 333 mL via INTRAVENOUS

## 2022-10-23 MED ORDER — LACTATED RINGERS IV SOLN: 500.0000 mL | INTRAVENOUS | Status: DC | PRN

## 2022-10-23 MED ORDER — OXYCODONE-ACETAMINOPHEN 5-325 MG PO TABS: 2.0000 | ORAL_TABLET | ORAL | Status: DC | PRN

## 2022-10-23 MED ORDER — TERBUTALINE SULFATE 1 MG/ML IJ SOLN: 0.2500 mg | Freq: Once | INTRAMUSCULAR | Status: DC | PRN

## 2022-10-23 MED ORDER — LACTATED RINGERS IV SOLN: INTRAVENOUS | Status: DC

## 2022-10-23 MED ORDER — ONDANSETRON HCL 4 MG/2ML IJ SOLN
4.0000 mg | Freq: Four times a day (QID) | INTRAMUSCULAR | Status: DC | PRN
  Filled 2022-10-23 (×3): qty 2

## 2022-10-23 MED ORDER — SOD CITRATE-CITRIC ACID 500-334 MG/5ML PO SOLN: 30.0000 mL | ORAL | Status: DC | PRN

## 2022-10-23 MED ORDER — OXYTOCIN-SODIUM CHLORIDE 30-0.9 UT/500ML-% IV SOLN
1.0000 m[IU]/min | INTRAVENOUS | Status: DC
  Administered 2022-10-24: 2 m[IU]/min via INTRAVENOUS
  Filled 2022-10-23: qty 500

## 2022-10-23 MED ORDER — FENTANYL CITRATE (PF) 100 MCG/2ML IJ SOLN: 50.0000 ug | Freq: Once | INTRAMUSCULAR | Status: DC

## 2022-10-23 MED ORDER — LIDOCAINE HCL (PF) 1 % IJ SOLN: 30.0000 mL | INTRAMUSCULAR | Status: DC | PRN

## 2022-10-23 MED ORDER — POTASSIUM CHLORIDE CRYS ER 20 MEQ PO TBCR
20.0000 meq | EXTENDED_RELEASE_TABLET | Freq: Three times a day (TID) | ORAL | Status: AC
  Administered 2022-10-23 – 2022-10-25 (×5): 20 meq via ORAL
  Filled 2022-10-23 (×7): qty 1

## 2022-10-23 MED ORDER — PROMETHAZINE HCL 25 MG PO TABS
25.0000 mg | ORAL_TABLET | Freq: Four times a day (QID) | ORAL | Status: DC | PRN
  Administered 2022-10-24: 25 mg via ORAL
  Filled 2022-10-23 (×2): qty 1

## 2022-10-23 MED ORDER — PROMETHAZINE HCL 12.5 MG RE SUPP: 12.5000 mg | Freq: Four times a day (QID) | RECTAL | Status: DC | PRN

## 2022-10-23 MED ORDER — FENTANYL CITRATE (PF) 100 MCG/2ML IJ SOLN
50.0000 ug | INTRAMUSCULAR | Status: DC | PRN
  Administered 2022-10-23 – 2022-10-24 (×5): 50 ug via INTRAVENOUS
  Filled 2022-10-23 (×5): qty 2

## 2022-10-23 MED ORDER — OXYTOCIN-SODIUM CHLORIDE 30-0.9 UT/500ML-% IV SOLN
2.5000 [IU]/h | INTRAVENOUS | Status: DC
  Administered 2022-10-25: 2.5 [IU]/h via INTRAVENOUS

## 2022-10-23 NOTE — MAU Note (Signed)
.  Joann Reid is a 21 y.o. at [redacted]w[redacted]d here in MAU reporting: she was told if her b/p was hight than 140/90 to be seen .Reports:  1. Her b/p had been higher . 150/91.  2. C/o mild headache, dizziness  and swelling in her feet.  3.Unsure if she has felt baby move today" I have not paid attention. "   4.having some n/v 5. Ctx q 7 min   Onset of complaint: today Pain score: 5 (headache) 6(ctx) Vitals:   10/23/22 1724  BP: (!) 145/87  Pulse: 83  Resp: 18  Temp: 98.5 F (36.9 C)     FHT:146 Lab orders placed from triage:  u/a PCR

## 2022-10-23 NOTE — Progress Notes (Signed)
Report given to L&D charge RN.

## 2022-10-23 NOTE — H&P (Signed)
OBSTETRIC ADMISSION HISTORY AND PHYSICAL  Joann Reid is a 21 y.o. female G1P0 with IUP at [redacted]w[redacted]d by ultrasound presenting for IOL for gestational hypertension. She reports +FMs, No LOF, no VB, no blurry vision, headaches or peripheral edema, and RUQ pain.  She plans on breast feeding. She request patient placental IUD for birth control. She received her prenatal care at  [facility]: By ultrasound --->  Estimated Date of Delivery: 11/02/22  Sono:    @[redacted]w[redacted]d , CWD, normal anatomy, cephalic presentation, posterior placenta, 2647g, 56%% EFW   Prenatal History/Complications:   Past Medical History: Past Medical History:  Diagnosis Date   Asthma     Past Surgical History: Past Surgical History:  Procedure Laterality Date   EYE SURGERY      Obstetrical History: OB History     Gravida  1   Para      Term      Preterm      AB      Living         SAB      IAB      Ectopic      Multiple      Live Births              Social History Social History   Socioeconomic History   Marital status: Single    Spouse name: Not on file   Number of children: Not on file   Years of education: Not on file   Highest education level: Not on file  Occupational History   Not on file  Tobacco Use   Smoking status: Never   Smokeless tobacco: Never  Vaping Use   Vaping Use: Never used  Substance and Sexual Activity   Alcohol use: Never   Drug use: Never   Sexual activity: Not Currently    Birth control/protection: None  Other Topics Concern   Not on file  Social History Narrative   Not on file   Social Determinants of Health   Financial Resource Strain: Not on file  Food Insecurity: Not on file  Transportation Needs: Not on file  Physical Activity: Not on file  Stress: Not on file  Social Connections: Not on file    Family History: Family History  Problem Relation Age of Onset   Hypertension Mother    Diabetes Maternal Grandmother      Allergies: Allergies  Allergen Reactions   Hydrocodone     Facility-Administered Medications Prior to Admission  Medication Dose Route Frequency Provider Last Rate Last Admin   fluticasone (FLOVENT HFA) 110 MCG/ACT inhaler 2 puff  2 puff Inhalation BID Guss Bunde, MD       polyethylene glycol powder (GLYCOLAX/MIRALAX) container 255 g  1 Container Oral Once Guss Bunde, MD       Medications Prior to Admission  Medication Sig Dispense Refill Last Dose   Blood Pressure Monitoring (BLOOD PRESSURE CUFF) MISC 1 Device by Does not apply route as needed. 1 each 0 10/23/2022   Prenatal Vit-Fe Fumarate-FA (MULTIVITAMIN-PRENATAL) 27-0.8 MG TABS tablet Take 1 tablet by mouth daily at 12 noon.   10/23/2022   aspirin 81 MG chewable tablet Chew 1 tablet (81 mg total) by mouth daily. (Patient not taking: Reported on 09/28/2022) 90 tablet 1 Unknown   cyclobenzaprine (FLEXERIL) 10 MG tablet Take 1 tablet (10 mg total) by mouth 2 (two) times daily as needed for muscle spasms. (Patient not taking: Reported on 10/20/2022) 20 tablet 1 Unknown  Misc. Devices MISC Dispense one maternity belt for patient (Patient not taking: Reported on 07/17/2022) 1 each 0 Unknown   ondansetron (ZOFRAN) 4 MG tablet Take 1 tablet (4 mg total) by mouth every 8 (eight) hours as needed for nausea or vomiting. (Patient not taking: Reported on 10/20/2022) 20 tablet 1 Unknown   scopolamine (TRANSDERM-SCOP) 1 MG/3DAYS Place 1 patch (1.5 mg total) onto the skin every 3 (three) days. 3 patch 1 Unknown     Review of Systems   All systems reviewed and negative except as stated in HPI  Blood pressure (!) 143/97, pulse 90, temperature 98.5 F (36.9 C), resp. rate 18, height 5\' 5"  (1.651 m), weight 104.3 kg, last menstrual period 01/14/2022. General appearance: alert, cooperative, and appears stated age Lungs: normal work of breathing  Heart: regular rate  Abdomen: soft, non-tender; bowel sounds normal Pelvic:  1.5/70/-2 Extremities: Homans sign is negative, no sign of DVT Presentation: cephalic Fetal monitoringBaseline: 125 bpm, Variability: Good {> 6 bpm), Accelerations: Reactive, and Decelerations: Absent Uterine activityFrequency: Every 2-4 minutes Dilation: 1.5 Effacement (%): 50 Station: -2 Exam by:: Valla Leaver, RN   Prenatal labs: ABO, Rh: --/--/O POS (06/22 1400) Antibody: NEG (06/22 1400) Rubella: 1.57 (05/22 1118) RPR: NON-REACTIVE (09/12 0859)  HBsAg: NON-REACTIVE (05/22 1118)  HIV: NON-REACTIVE (09/12 0859)  GBS:   Neg 1 hr Glucola Neg Genetic screening  LR Anatomy US Normal   Prenatal Transfer Tool  Maternal Diabetes: None Genetic Screening: Normal Maternal Ultrasounds/Referrals: Normal Fetal Ultrasounds or other Referrals:  None Maternal Substance Abuse:  No Significant Maternal Medications:  None Significant Maternal Lab Results:  Group B Strep negative Number of Prenatal Visits:greater than 3 verified prenatal visits Other Comments:  None  Results for orders placed or performed during the hospital encounter of 10/23/22 (from the past 24 hour(s))  Urinalysis, Routine w reflex microscopic   Collection Time: 10/23/22  5:31 PM  Result Value Ref Range   Color, Urine STRAW (A) YELLOW   APPearance CLEAR CLEAR   Specific Gravity, Urine 1.003 (L) 1.005 - 1.030   pH 7.0 5.0 - 8.0   Glucose, UA NEGATIVE NEGATIVE mg/dL   Hgb urine dipstick NEGATIVE NEGATIVE   Bilirubin Urine NEGATIVE NEGATIVE   Ketones, ur NEGATIVE NEGATIVE mg/dL   Protein, ur NEGATIVE NEGATIVE mg/dL   Nitrite NEGATIVE NEGATIVE   Leukocytes,Ua NEGATIVE NEGATIVE  Protein / creatinine ratio, urine   Collection Time: 10/23/22  5:31 PM  Result Value Ref Range   Creatinine, Urine 44 mg/dL   Total Protein, Urine <6 mg/dL   Protein Creatinine Ratio        0.00 - 0.15 mg/mg[Cre]    Patient Active Problem List   Diagnosis Date Noted   Depression affecting pregnancy 08/11/2022   Obesity affecting  pregnancy 06/19/2022   Supervision of normal first pregnancy 04/20/2022   IBS (irritable bowel syndrome) 10/30/2021   Moderate persistent asthma 08/19/2020   Nonintractable episodic headache 01/26/2020    Assessment/Plan:  Joann Reid is a 21 y.o. G1P0 at [redacted]w[redacted]d here for IOL for gHTN  #Labor: Will place Foley bulb in the MAU.  Will likely need Cytotec once arrives to the floor but may be able to start Pitocin. #Pain: Per patient. Would like to start with IV and then likely epidural  #FWB: Cat 1 #ID:  GBS neg #MOF: Both #MOC:ppIUD #gHTN: Rule in for gHTN from todays BP. Consistently mild range here in the MAU. No Pre E symptoms. Normal labs 2 weeks ago. Repeat CMP, PC  ratio pending.   Celedonio Savage, MD  10/23/2022, 7:15 PM

## 2022-10-23 NOTE — MAU Provider Note (Signed)
History     CSN: [id number]  Arrival date and time: 10/23/22 1657   None     Chief Complaint  Patient presents with   Hypertension   Joann Reid is a 21 y.o. G1P0 at [redacted]w[redacted]d who presents to MAU from [facility] for evaluation of irregular contractions as well as elevated blood pressures.  She was initially rule out labor but the staff that she had had elevated blood pressures at home.  She was seen in her clinic on 11/24 and had blood pressures that were slightly below hypertension rule in at 138/89.  Patient reports that today around noon she did a automated blood pressure check and it was 140s/90s.  Her mom who does health screening exams did a manual blood pressure and it was 150s/90s.  Denies any other symptoms.    OB History     Gravida  1   Para      Term      Preterm      AB      Living         SAB      IAB      Ectopic      Multiple      Live Births              Past Medical History:  Diagnosis Date   Asthma     Past Surgical History:  Procedure Laterality Date   EYE SURGERY      Family History  Problem Relation Age of Onset   Hypertension Mother    Diabetes Maternal Grandmother     Social History   Tobacco Use   Smoking status: Never   Smokeless tobacco: Never  Vaping Use   Vaping Use: Never used  Substance Use Topics   Alcohol use: Never   Drug use: Never    Allergies:  Allergies  Allergen Reactions   Hydrocodone     Facility-Administered Medications Prior to Admission  Medication Dose Route Frequency Provider Last Rate Last Admin   fluticasone (FLOVENT HFA) 110 MCG/ACT inhaler 2 puff  2 puff Inhalation BID Lesly Dukes, MD       polyethylene glycol powder (GLYCOLAX/MIRALAX) container 255 g  1 Container Oral Once Lesly Dukes, MD       Medications Prior to Admission  Medication Sig Dispense Refill Last Dose   Blood Pressure Monitoring (BLOOD PRESSURE CUFF) MISC 1 Device by Does not apply route as needed. 1 each 0  10/23/2022   Prenatal Vit-Fe Fumarate-FA (MULTIVITAMIN-PRENATAL) 27-0.8 MG TABS tablet Take 1 tablet by mouth daily at 12 noon.   10/23/2022   aspirin 81 MG chewable tablet Chew 1 tablet (81 mg total) by mouth daily. (Patient not taking: Reported on 09/28/2022) 90 tablet 1 Unknown   cyclobenzaprine (FLEXERIL) 10 MG tablet Take 1 tablet (10 mg total) by mouth 2 (two) times daily as needed for muscle spasms. (Patient not taking: Reported on 10/20/2022) 20 tablet 1 Unknown   Misc. Devices MISC Dispense one maternity belt for patient (Patient not taking: Reported on 07/17/2022) 1 each 0 Unknown   ondansetron (ZOFRAN) 4 MG tablet Take 1 tablet (4 mg total) by mouth every 8 (eight) hours as needed for nausea or vomiting. (Patient not taking: Reported on 10/20/2022) 20 tablet 1 Unknown   scopolamine (TRANSDERM-SCOP) 1 MG/3DAYS Place 1 patch (1.5 mg total) onto the skin every 3 (three) days. 3 patch 1 Unknown    Review of Systems  Constitutional:  Negative for fever.  HENT:  Negative for congestion.   Eyes:  Negative for visual disturbance.  Respiratory:  Negative for shortness of breath.   Cardiovascular:  Positive for leg swelling (Has been going on for several weeks).  Gastrointestinal:  Positive for abdominal pain (Contractions).  Neurological:  Negative for headaches.  All other systems reviewed and are negative.  Physical Exam   Blood pressure 137/82, pulse 82, temperature 98.5 F (36.9 C), resp. rate 18, height 5\' 5"  (1.651 m), weight 104.3 kg, last menstrual period 01/14/2022.  Physical Exam Vitals reviewed.  Constitutional:      Appearance: Normal appearance.  HENT:     Head: Normocephalic.     Mouth/Throat:     Mouth: Mucous membranes are moist.  Eyes:     Extraocular Movements: Extraocular movements intact.  Cardiovascular:     Rate and Rhythm: Normal rate.  Pulmonary:     Effort: Pulmonary effort is normal.  Abdominal:     Tenderness: There is no abdominal tenderness.      Comments: Gravid   Musculoskeletal:     Cervical back: Normal range of motion.     Right lower leg: Edema present.     Left lower leg: Edema present.  Skin:    Capillary Refill: Capillary refill takes less than 2 seconds.  Neurological:     General: No focal deficit present.     Mental Status: She is alert.  Psychiatric:        Mood and Affect: Mood normal.     MAU Course  Procedures  MDM CBC CMP PC ratio Type and screen   Assessment and Plan  Joann Reid is a 21 yo G1 presenting for contractions and elevated Bps.  Denies any signs of preeclampsia.  Mild range elevation will in the MAU.  Does rule in for gestational hypertension if we take the mothers manual blood pressure reading.  Spoke with attending on-call Dr. Elgie Congo.  Will admit for induction of labor for gestational hypertension.  Please see H&P for any other information.  Concepcion Living 10/23/2022, 6:44 PM

## 2022-10-24 ENCOUNTER — Inpatient Hospital Stay (HOSPITAL_COMMUNITY): Payer: Medicaid Other | Admitting: Anesthesiology

## 2022-10-24 DIAGNOSIS — E876 Hypokalemia: Secondary | ICD-10-CM | POA: Diagnosis present

## 2022-10-24 LAB — CBC WITH DIFFERENTIAL/PLATELET
Abs Immature Granulocytes: 0.06 10*3/uL (ref 0.00–0.07)
Basophils Absolute: 0 10*3/uL (ref 0.0–0.1)
Basophils Relative: 0 %
Eosinophils Absolute: 0.1 10*3/uL (ref 0.0–0.5)
Eosinophils Relative: 1 %
HCT: 32.4 % — ABNORMAL LOW (ref 36.0–46.0)
Hemoglobin: 11 g/dL — ABNORMAL LOW (ref 12.0–15.0)
Immature Granulocytes: 1 %
Lymphocytes Relative: 21 %
Lymphs Abs: 2.5 10*3/uL (ref 0.7–4.0)
MCH: 31.1 pg (ref 26.0–34.0)
MCHC: 34 g/dL (ref 30.0–36.0)
MCV: 91.5 fL (ref 80.0–100.0)
Monocytes Absolute: 1.1 10*3/uL — ABNORMAL HIGH (ref 0.1–1.0)
Monocytes Relative: 9 %
Neutro Abs: 8 10*3/uL — ABNORMAL HIGH (ref 1.7–7.7)
Neutrophils Relative %: 68 %
Platelets: 171 10*3/uL (ref 150–400)
RBC: 3.54 MIL/uL — ABNORMAL LOW (ref 3.87–5.11)
RDW: 12.7 % (ref 11.5–15.5)
WBC: 11.7 10*3/uL — ABNORMAL HIGH (ref 4.0–10.5)
nRBC: 0 % (ref 0.0–0.2)

## 2022-10-24 LAB — RPR: RPR Ser Ql: NONREACTIVE

## 2022-10-24 MED ORDER — DIPHENHYDRAMINE HCL 50 MG/ML IJ SOLN
12.5000 mg | INTRAMUSCULAR | Status: DC | PRN
  Filled 2022-10-24: qty 1

## 2022-10-24 MED ORDER — ONDANSETRON HCL 4 MG/2ML IJ SOLN
4.0000 mg | Freq: Four times a day (QID) | INTRAMUSCULAR | Status: DC | PRN
  Administered 2022-10-24 – 2022-10-25 (×3): 4 mg via INTRAVENOUS

## 2022-10-24 MED ORDER — PHENYLEPHRINE 80 MCG/ML (10ML) SYRINGE FOR IV PUSH (FOR BLOOD PRESSURE SUPPORT)
80.0000 ug | PREFILLED_SYRINGE | INTRAVENOUS | Status: DC | PRN
  Filled 2022-10-24: qty 10

## 2022-10-24 MED ORDER — FENTANYL-BUPIVACAINE-NACL 0.5-0.125-0.9 MG/250ML-% EP SOLN
12.0000 mL/h | EPIDURAL | Status: DC | PRN
  Filled 2022-10-24: qty 250

## 2022-10-24 MED ORDER — EPHEDRINE 5 MG/ML INJ: 10.0000 mg | INTRAVENOUS | Status: DC | PRN

## 2022-10-24 MED ORDER — EPHEDRINE 5 MG/ML INJ
10.0000 mg | INTRAVENOUS | Status: DC | PRN
  Filled 2022-10-24: qty 5

## 2022-10-24 MED ORDER — FENTANYL-BUPIVACAINE-NACL 0.5-0.125-0.9 MG/250ML-% EP SOLN
EPIDURAL | Status: DC | PRN
  Administered 2022-10-24: 12 mL/h via EPIDURAL

## 2022-10-24 MED ORDER — LIDOCAINE HCL (PF) 1 % IJ SOLN
INTRAMUSCULAR | Status: DC | PRN
  Administered 2022-10-24: 2 mL via EPIDURAL
  Administered 2022-10-24: 10 mL via EPIDURAL

## 2022-10-24 MED ORDER — LACTATED RINGERS IV SOLN: 500.0000 mL | Freq: Once | INTRAVENOUS | Status: DC

## 2022-10-24 MED ORDER — PHENYLEPHRINE 80 MCG/ML (10ML) SYRINGE FOR IV PUSH (FOR BLOOD PRESSURE SUPPORT): 80.0000 ug | PREFILLED_SYRINGE | INTRAVENOUS | Status: DC | PRN

## 2022-10-24 NOTE — Anesthesia Procedure Notes (Signed)
Epidural Patient location during procedure: OB Start time: 10/24/2022 2:44 PM End time: 10/24/2022 2:55 PM  Staffing Anesthesiologist: Lannie Fields, DO Performed: anesthesiologist   Preanesthetic Checklist Completed: patient identified, IV checked, risks and benefits discussed, monitors and equipment checked, pre-op evaluation and timeout performed  Epidural Patient position: sitting Prep: DuraPrep and site prepped and draped Patient monitoring: continuous pulse ox, blood pressure, heart rate and cardiac monitor Approach: midline Location: L3-L4 Injection technique: LOR air  Needle:  Needle type: Tuohy  Needle gauge: 17 G Needle length: 9 cm Needle insertion depth: 6 cm Catheter type: closed end flexible Catheter size: 19 Gauge Catheter at skin depth: 11 cm Test dose: negative  Assessment Sensory level: T8 Events: blood not aspirated, injection not painful, no injection resistance, no paresthesia and negative IV test  Additional Notes Patient identified. Risks/Benefits/Options discussed with patient including but not limited to bleeding, infection, nerve damage, paralysis, failed block, incomplete pain control, headache, blood pressure changes, nausea, vomiting, reactions to medication both or allergic, itching and postpartum back pain. Confirmed with bedside nurse the patient's most recent platelet count. Confirmed with patient that they are not currently taking any anticoagulation, have any bleeding history or any family history of bleeding disorders. Patient expressed understanding and wished to proceed. All questions were answered. Sterile technique was used throughout the entire procedure. Please see nursing notes for vital signs. Test dose was given through epidural catheter and negative prior to continuing to dose epidural or start infusion. Warning signs of high block given to the patient including shortness of breath, tingling/numbness in hands, complete motor  block, or any concerning symptoms with instructions to call for help. Patient was given instructions on fall risk and not to get out of bed. All questions and concerns addressed with instructions to call with any issues or inadequate analgesia.  Reason for block:procedure for pain

## 2022-10-24 NOTE — Anesthesia Preprocedure Evaluation (Signed)
Anesthesia Evaluation  Patient identified by MRN, date of birth, ID band Patient awake    Reviewed: Allergy & Precautions, Patient's Chart, lab work & pertinent test results  Airway Mallampati: II  TM Distance: >3 FB Neck ROM: Full    Dental no notable dental hx.    Pulmonary asthma    Pulmonary exam normal breath sounds clear to auscultation       Cardiovascular negative cardio ROS Normal cardiovascular exam Rhythm:Regular Rate:Normal     Neuro/Psych  Headaches PSYCHIATRIC DISORDERS  Depression       GI/Hepatic negative GI ROS, Neg liver ROS,,,  Endo/Other  K 2.9  Renal/GU negative Renal ROS  negative genitourinary   Musculoskeletal negative musculoskeletal ROS (+)    Abdominal   Peds negative pediatric ROS (+)  Hematology negative hematology ROS (+) Hb 11, plt 171   Anesthesia Other Findings   Reproductive/Obstetrics (+) Pregnancy                             Anesthesia Physical Anesthesia Plan  ASA: 2  Anesthesia Plan: Epidural   Post-op Pain Management:    Induction:   PONV Risk Score and Plan: 2  Airway Management Planned: Natural Airway  Additional Equipment: None  Intra-op Plan:   Post-operative Plan:   Informed Consent: I have reviewed the patients History and Physical, chart, labs and discussed the procedure including the risks, benefits and alternatives for the proposed anesthesia with the patient or authorized representative who has indicated his/her understanding and acceptance.       Plan Discussed with:   Anesthesia Plan Comments:        Anesthesia Quick Evaluation

## 2022-10-24 NOTE — Plan of Care (Signed)

## 2022-10-24 NOTE — Progress Notes (Signed)
LABOR PROGRESS NOTE  Joann Reid is a 21 y.o. G1P0 at [redacted]w[redacted]d  admitted for IOL for gHTN  Subjective: Comfortable with epidural in place.   Objective: BP 133/82   Pulse 83   Temp 98.5 F (36.9 C) (Oral)   Resp 16   Ht 5\' 5"  (1.651 m)   Wt 104.3 kg   LMP 01/14/2022   SpO2 99%   BMI 38.27 kg/m  or  Vitals:   10/24/22 1532 10/24/22 1534 10/24/22 1600 10/24/22 1630  BP: (!) 110/47 123/64 121/71 133/82  Pulse: 83 80 81 83  Resp:      Temp:      TempSrc:      SpO2:   99%   Weight:      Height:       Dilation: 4.5 Effacement (%): 70 Cervical Position: Middle Station: -1 Presentation: Vertex Exam by:: Dr. [email or web address] FHT: baseline rate 135, moderate varibility, + acel, No decel Toco: every 1-2 min   Labs: Lab Results  Component Value Date   WBC 11.7 (H) 10/24/2022   HGB 11.0 (L) 10/24/2022   HCT 32.4 (L) 10/24/2022   MCV 91.5 10/24/2022   PLT 171 10/24/2022    Patient Active Problem List   Diagnosis Date Noted   Hypokalemia 10/24/2022   Indication for care in labor or delivery 10/23/2022   Depression affecting pregnancy 08/11/2022   Obesity affecting pregnancy 06/19/2022   Supervision of normal first pregnancy 04/20/2022   IBS (irritable bowel syndrome) 10/30/2021   Moderate persistent asthma 08/19/2020   Nonintractable episodic headache 01/26/2020    Assessment / Plan: 21 y.o. G1P0 at [redacted]w[redacted]d here for IOL for gHTN   Labor: Currently on pitocin. Difficulty tracing contractions. AROM and IUP placed Fetal Wellbeing:  Cat 1  Pain Control:  Epidural in place  Anticipated MOD:  Vaginal   [redacted]w[redacted]d, MD  OB Fellow  10/24/2022, 5:04 PM

## 2022-10-24 NOTE — Progress Notes (Signed)
Patient ID: Joann Reid, female   DOB: Nov 27, 2001, 21 y.o.   MRN: [id number]  Appears to be resting- not disturbed; s/p foley bulb and Pit started at 0015  BPs 155/93, 145/98 FHR 120s, +accels, no decels Ctx q 2 mins with Pit at 14mu/min Cx 3+/60/vtx -1 w start of Pit  AST 53, all other pre-e labs neg K+ 2.9  IUP@38 .5wks New onset gHTN Hypokalemia IOL process  Replace K+ w Kdur over 2 days Continue to [employer] to achieve active labor Watch BPs  Joann Reid CNM 10/24/2022 2:16 AM

## 2022-10-24 NOTE — Progress Notes (Signed)
Labor Progress Note  Joann Reid is a 21 y.o. G1P0 at [redacted]w[redacted]d presented for IOL GHTN  S: Patient doing well, declining HSV despite having trouble tracing fetus consistently.  O:  BP 132/89   Pulse 82   Temp 98.2 F (36.8 C) (Oral)   Resp 20   Ht 5\' 5"  (1.651 m)   Wt 104.3 kg   LMP 01/14/2022   SpO2 99%   BMI 38.27 kg/m  EFM: 135 bpm/Moderate variability/ 15x15 accels/ None decels  CVE: Dilation: 6 Effacement (%): 70 Cervical Position: Middle Station: 0 Presentation: Vertex Exam by:: [email or web address] RN   A&P: 21 y.o. G1P0 [redacted]w[redacted]d  here for IOL as above  #Labor: Progressing well.  Adjusted toco and maternal position, still what seems like tachysystole, however FHT reassuring #Pain: Epidural #FWB: CAT 1 #GBS negative #gHTN: BP elevated in the 130s/80s.  Preeclampsia labs WNL  [redacted]w[redacted]d, DO FMOB Fellow, Faculty practice [facility], [facility] 10/24/22  8:32 PM

## 2022-10-25 ENCOUNTER — Encounter (HOSPITAL_COMMUNITY): Payer: Self-pay | Admitting: Family Medicine

## 2022-10-25 DIAGNOSIS — O134 Gestational [pregnancy-induced] hypertension without significant proteinuria, complicating childbirth: Secondary | ICD-10-CM

## 2022-10-25 DIAGNOSIS — Z30430 Encounter for insertion of intrauterine contraceptive device: Secondary | ICD-10-CM

## 2022-10-25 DIAGNOSIS — Z3A38 38 weeks gestation of pregnancy: Secondary | ICD-10-CM

## 2022-10-25 DIAGNOSIS — Z975 Presence of (intrauterine) contraceptive device: Secondary | ICD-10-CM

## 2022-10-25 HISTORY — PX: IUD INSERTION: OBO1003

## 2022-10-25 LAB — BASIC METABOLIC PANEL
Anion gap: 7 (ref 5–15)
BUN: <5 mg/dL — ABNORMAL LOW (ref 6–20)
CO2: 22 mmol/L (ref 22–32)
Calcium: 8.5 mg/dL — ABNORMAL LOW (ref 8.9–10.3)
Chloride: 109 mmol/L (ref 98–111)
Creatinine, Ser: 0.8 mg/dL (ref 0.44–1.00)
GFR, Estimated: >60 mL/min (ref >60)
Glucose, Bld: 114 mg/dL — ABNORMAL HIGH (ref 70–99)
Potassium: 3 mmol/L — ABNORMAL LOW (ref 3.5–5.1)
Sodium: 138 mmol/L (ref 135–145)

## 2022-10-25 LAB — CBC
HCT: 29.5 % — ABNORMAL LOW (ref 36.0–46.0)
Hemoglobin: 10.1 g/dL — ABNORMAL LOW (ref 12.0–15.0)
MCH: 31.7 pg (ref 26.0–34.0)
MCHC: 34.2 g/dL (ref 30.0–36.0)
MCV: 92.5 fL (ref 80.0–100.0)
Platelets: 148 10*3/uL — ABNORMAL LOW (ref 150–400)
RBC: 3.19 MIL/uL — ABNORMAL LOW (ref 3.87–5.11)
RDW: 12.7 % (ref 11.5–15.5)
WBC: 16.4 10*3/uL — ABNORMAL HIGH (ref 4.0–10.5)
nRBC: 0 % (ref 0.0–0.2)

## 2022-10-25 MED ORDER — ACETAMINOPHEN 325 MG PO TABS
650.0000 mg | ORAL_TABLET | ORAL | Status: DC | PRN
  Administered 2022-10-25 (×2): 650 mg via ORAL
  Filled 2022-10-25 (×2): qty 2

## 2022-10-25 MED ORDER — SODIUM CHLORIDE 0.9 % IV SOLN: 250.0000 mL | INTRAVENOUS | Status: DC | PRN

## 2022-10-25 MED ORDER — IBUPROFEN 600 MG PO TABS
600.0000 mg | ORAL_TABLET | Freq: Four times a day (QID) | ORAL | Status: DC
  Administered 2022-10-25 – 2022-10-26 (×6): 600 mg via ORAL
  Filled 2022-10-25 (×6): qty 1

## 2022-10-25 MED ORDER — FUROSEMIDE 20 MG PO TABS
20.0000 mg | ORAL_TABLET | Freq: Every day | ORAL | Status: DC
  Administered 2022-10-25 – 2022-10-26 (×2): 20 mg via ORAL
  Filled 2022-10-25 (×2): qty 1

## 2022-10-25 MED ORDER — ONDANSETRON HCL 4 MG/2ML IJ SOLN: 4.0000 mg | INTRAMUSCULAR | Status: DC | PRN

## 2022-10-25 MED ORDER — SODIUM CHLORIDE 0.9% FLUSH: 3.0000 mL | INTRAVENOUS | Status: DC | PRN

## 2022-10-25 MED ORDER — SIMETHICONE 80 MG PO CHEW: 80.0000 mg | CHEWABLE_TABLET | ORAL | Status: DC | PRN

## 2022-10-25 MED ORDER — DIPHENHYDRAMINE HCL 25 MG PO CAPS: 25.0000 mg | ORAL_CAPSULE | Freq: Four times a day (QID) | ORAL | Status: DC | PRN

## 2022-10-25 MED ORDER — WITCH HAZEL-GLYCERIN EX PADS: 1.0000 | MEDICATED_PAD | CUTANEOUS | Status: DC | PRN

## 2022-10-25 MED ORDER — NIFEDIPINE ER OSMOTIC RELEASE 30 MG PO TB24
30.0000 mg | ORAL_TABLET | Freq: Every day | ORAL | Status: DC
  Administered 2022-10-25 – 2022-10-26 (×2): 30 mg via ORAL
  Filled 2022-10-25 (×2): qty 1

## 2022-10-25 MED ORDER — BENZOCAINE-MENTHOL 20-0.5 % EX AERO
1.0000 | INHALATION_SPRAY | CUTANEOUS | Status: DC | PRN
  Administered 2022-10-25 (×2): 1 via TOPICAL
  Filled 2022-10-25 (×2): qty 56

## 2022-10-25 MED ORDER — DIBUCAINE (PERIANAL) 1 % EX OINT: 1.0000 | TOPICAL_OINTMENT | CUTANEOUS | Status: DC | PRN

## 2022-10-25 MED ORDER — ONDANSETRON HCL 4 MG PO TABS: 4.0000 mg | ORAL_TABLET | ORAL | Status: DC | PRN

## 2022-10-25 MED ORDER — ZOLPIDEM TARTRATE 5 MG PO TABS: 5.0000 mg | ORAL_TABLET | Freq: Every evening | ORAL | Status: DC | PRN

## 2022-10-25 MED ORDER — PRENATAL MULTIVITAMIN CH
1.0000 | ORAL_TABLET | Freq: Every day | ORAL | Status: DC
  Administered 2022-10-25 – 2022-10-26 (×2): 1 via ORAL
  Filled 2022-10-25 (×2): qty 1

## 2022-10-25 MED ORDER — SENNOSIDES-DOCUSATE SODIUM 8.6-50 MG PO TABS
2.0000 | ORAL_TABLET | ORAL | Status: DC
  Administered 2022-10-25 – 2022-10-26 (×2): 2 via ORAL
  Filled 2022-10-25 (×2): qty 2

## 2022-10-25 MED ORDER — COCONUT OIL OIL: 1.0000 | TOPICAL_OIL | Status: DC | PRN

## 2022-10-25 MED ORDER — LEVONORGESTREL 20 MCG/DAY IU IUD
1.0000 | INTRAUTERINE_SYSTEM | Freq: Once | INTRAUTERINE | Status: AC
  Administered 2022-10-25: 1 via INTRAUTERINE
  Filled 2022-10-25: qty 1

## 2022-10-25 MED ORDER — SODIUM CHLORIDE 0.9% FLUSH
3.0000 mL | Freq: Two times a day (BID) | INTRAVENOUS | Status: DC
  Administered 2022-10-25: 3 mL via INTRAVENOUS

## 2022-10-25 NOTE — Lactation Note (Signed)
This note was copied from a baby's chart. Lactation Consultation Note  Patient Name: Joann Reid [id number] Date: 10/25/2022 Reason for consult: Initial assessment;Early term 37-38.6wks;Primapara;1st time breastfeeding Age:21 hours   P1: Early term infant at 38+6 weeks Feeding preference: Breast/formula  "Katalaya" was swaddled and asleep at the breast when I arrived.  Mother's feeding goal is to breast/formula feed; verified with mother.  Taught hand expression and finger fed colostrum drops to baby.  Assisted to latch, however, she was not interested in initiating a suck once at the breast.  Demonstrated breast compressions and gentle stimulation; no suck at this time.  Reassurance given and placed her STS on mother's chest.  Encouraged to continue observing for cues and to call for latch assistance if desired.  Suggested breast feeding first with every feed before providing supplementation.  Suggested mother call me back for latch assistance with the next feeding.  Visitors present.   Maternal Data Has patient been taught Hand Expression?: Yes Does the patient have breastfeeding experience prior to this delivery?: No  Feeding Mother's Current Feeding Choice: Breast Milk and Formula  LATCH Score Latch: Repeated attempts needed to sustain latch, nipple held in mouth throughout feeding, stimulation needed to elicit sucking reflex.  Audible Swallowing: None  Type of Nipple: Everted at rest and after stimulation  Comfort (Breast/Nipple): Soft / non-tender  Hold (Positioning): Assistance needed to correctly position infant at breast and maintain latch.  LATCH Score: 6   Lactation Tools Discussed/Used    Interventions Interventions: Breast feeding basics reviewed;Assisted with latch;Skin to skin;Breast massage;Hand express;Breast compression;Expressed milk;Position options;Support pillows;Adjust position;Education  Discharge    Consult Status Consult Status:  Follow-up Date: 10/26/22 Follow-up type: In-patient    Dora Sims 10/25/2022, 9:56 AM

## 2022-10-25 NOTE — Procedures (Signed)
  Post-Placental IUD Insertion Procedure Note  Patient identified, informed consent signed prior to delivery, signed copy in chart, time out was performed.    Vaginal, labial and perineal areas thoroughly inspected for lacerations. 2nd degree laceration identified - not hemostatic repaired prior to insertion of IUD.    Mirena IUD grasped between sterile gloved fingers. Sterile lubrication applied to sterile gloved hand for ease of insertion. Fundus identified through abdominal wall using non-insertion hand. IUD inserted to fundus with bimanual technique. IUD carefully released at the fundus and insertion hand gently removed from vagina.   Strings trimmed to the level of the introitus. Patient tolerated procedure well.  Lot # [id number]  Expiration Date: 2025/NOV  Patient given post procedure instructions and IUD care card with expiration date.  Patient is asked to keep IUD strings tucked in her vagina until her postpartum follow up visit in 4-6 weeks. Patient advised to abstain from sexual intercourse and pulling on strings before her follow-up visit. Patient verbalized an understanding of the plan of care and agrees.

## 2022-10-25 NOTE — Plan of Care (Signed)
Contacted doctors due to increase blood pressure after two checks. Doctor added Procardia 30 mg due at 1530 on 10/25/2022. Patient is aware. Requested to call MD if blood pressures are 160/110 or 140/90 during morning 10/26/2022 vitals.

## 2022-10-25 NOTE — Discharge Summary (Addendum)
Postpartum Discharge Summary     Patient Name: Joann Reid DOB: August 24, 2001 MRN: [id number]  Date of admission: 10/23/2022 Delivery date:10/25/2022  Delivering provider: Shelda Pal  Date of discharge: 10/26/2022  Admitting diagnosis: Indication for care in labor or delivery [O75.9] Intrauterine pregnancy: [redacted]w[redacted]d    Secondary diagnosis:  Principal Problem:   Vaginal delivery Active Problems:   Supervision of normal first pregnancy   Moderate persistent asthma   Obesity affecting pregnancy   Depression affecting pregnancy   Hypokalemia   IUD (intrauterine device) in place  Additional problems: gHTN    Discharge diagnosis: Term Pregnancy Delivered and Gestational Hypertension                                              Post partum procedures: IUD placement  Augmentation: AROM, Pitocin, and Cytotec Complications: None  Hospital course: Induction of Labor With Vaginal Delivery   21y.o. yo G1P0 at 347w6das admitted to the hospital 10/23/2022 for induction of labor.  Indication for induction: Gestational hypertension.  Patient had an labor course complicated by none Membrane Rupture Time/Date: 4:25 PM ,10/24/2022   Delivery Method:Vaginal, Spontaneous  Episiotomy: None  Lacerations:  2nd degree;Perineal  Details of delivery can be found in separate delivery note.  Patient had a postpartum course complicated by elevated BP. Started on lasix and procardia. Patient is discharged home 10/26/22.  Newborn Data: Birth date:10/25/2022  Birth time:2:16 AM  Gender:Female  Living status:Living  Apgars:7 ,8  Weight:3290 g   Magnesium Sulfate received: No BMZ received: No Rhophylac:N/A MMR:N/A T-DaP:Given prenatally Flu: No Transfusion:No  Physical exam  Vitals:   10/25/22 1328 10/25/22 1805 10/25/22 2050 10/26/22 0556  BP: (!) 135/90 (!) 140/93 134/82 133/89  Pulse: 86 97 86 92  Resp:   20 18  Temp:   (!) 97.2 F (36.2 C) 97.7 F (36.5 C)  TempSrc:    Oral Oral  SpO2: 100% 100% 100%   Weight:      Height:       General: alert, cooperative, and no distress Lochia: appropriate Uterine Fundus: firm Incision: N/A DVT Evaluation: No evidence of DVT seen on physical exam. Labs: Lab Results  Component Value Date   WBC 16.4 (H) 10/25/2022   HGB 10.1 (L) 10/25/2022   HCT 29.5 (L) 10/25/2022   MCV 92.5 10/25/2022   PLT 148 (L) 10/25/2022      Latest Ref Rng & Units 10/25/2022    7:42 AM  CMP  Glucose 70 - 99 mg/dL 114   BUN 6 - 20 mg/dL <5   Creatinine 0.44 - 1.00 mg/dL 0.80   Sodium 135 - 145 mmol/L 138   Potassium 3.5 - 5.1 mmol/L 3.0   Chloride 98 - 111 mmol/L 109   CO2 22 - 32 mmol/L 22   Calcium 8.9 - 10.3 mg/dL 8.5    Edinburgh Score:    10/25/2022    4:52 PM  Edinburgh Postnatal Depression Scale Screening Tool  I have been able to laugh and see the funny side of things. 0  I have looked forward with enjoyment to things. 0  I have blamed myself unnecessarily when things went wrong. 1  I have been anxious or worried for no good reason. 1  I have felt scared or panicky for no good reason. 1  Things have been getting on  top of me. 0  I have been so unhappy that I have had difficulty sleeping. 0  I have felt sad or miserable. 0  I have been so unhappy that I have been crying. 1  The thought of harming myself has occurred to me. 0  Edinburgh Postnatal Depression Scale Total 4     After visit meds:  Allergies as of 10/26/2022       Reactions   Norco [hydrocodone-acetaminophen] Nausea Only        Medication List     STOP taking these medications    aspirin 81 MG chewable tablet       TAKE these medications    benzocaine-Menthol 20-0.5 % Aero Commonly known as: DERMOPLAST Apply 1 Application topically as needed for irritation (perineal discomfort).   cyclobenzaprine 10 MG tablet Commonly known as: FLEXERIL Take 1 tablet (10 mg total) by mouth 2 (two) times daily as needed for muscle spasms.    furosemide 20 MG tablet Commonly known as: Lasix Take 1 tablet (20 mg total) by mouth daily.   NIFEdipine 30 MG 24 hr tablet Commonly known as: ADALAT CC Take 1 tablet (30 mg total) by mouth daily.   ondansetron 4 MG tablet Commonly known as: Zofran Take 1 tablet (4 mg total) by mouth every 8 (eight) hours as needed for nausea or vomiting.   PRENATAL PO Take 1 tablet by mouth daily.   Transderm-Scop 1 MG/3DAYS Generic drug: scopolamine Place 1 patch (1.5 mg total) onto the skin every 3 (three) days.         Discharge home in stable condition Infant Feeding: Bottle and Breast Infant Disposition:home with mother Discharge instruction: per After Visit Summary and Postpartum booklet. Activity: Advance as tolerated. Pelvic rest for 6 weeks.  Diet: routine diet Future Appointments: Future Appointments  Date Time Provider [place]  10/27/2022  8:30 AM Rasch, Artist Pais, NP [facility]   Follow up Visit: Message Sent to [facility] 11/26  Please schedule this patient for a In person postpartum visit in 4 weeks with the following provider: Any provider. Additional Postpartum F/U:Postpartum Depression checkup and BP check 1 week  High risk pregnancy complicated by: HTN Delivery mode:  Vaginal, Spontaneous  Anticipated Birth Control:  PP IUD placed   10/26/2022 Concepcion Living, MD

## 2022-10-25 NOTE — Progress Notes (Signed)
Notified Dr. Camelia Phenes of BPs 141/97 @ 0430 and 137/86 @ 0534. Pt asymptomatic. No new orders received at this time, lasix order to be placed by MD.

## 2022-10-25 NOTE — Lactation Note (Signed)
This note was copied from a baby's chart. Lactation Consultation Note  Patient Name: Joann Reid NATFT'D Date: 10/25/2022 Reason for consult: L&D Initial assessment;Early term 37-38.6wks Age:21 hours Per Birth Parent, she is  unsure if she really wants to breastfeed, Birth Parent decided she would  ry it in L&D. Birth Parent feeding choice is : breast and formula feeding, Birth Parent did attend breastfeeding classes in pregnancy. Birth Parent latched infant on her right breast  with ease using the football hold position, infant latched with depth,  sustaining latch and  infant was still breastfeeding after 10 minutes when LC left the room.  Birth Parent knows to breastfeed infant according to hunger cues, on demand, 8 to 12+ times within 24 hours, STS. Birth Parent knows to call RN/LC for further latch assistance if needed.  Maternal Data Does the patient have breastfeeding experience prior to this delivery?: No  Feeding Mother's Current Feeding Choice: Breast Milk and Formula  LATCH Score Latch: Grasps breast easily, tongue down, lips flanged, rhythmical sucking.  Audible Swallowing: Spontaneous and intermittent  Type of Nipple: Everted at rest and after stimulation  Comfort (Breast/Nipple): Soft / non-tender  Hold (Positioning): Assistance needed to correctly position infant at breast and maintain latch.  LATCH Score: 9   Lactation Tools Discussed/Used    Interventions Interventions: Assisted with latch;Support pillows;Position options;Skin to skin;Breast compression;Education;Adjust position  Discharge    Consult Status Consult Status: Follow-up from L&D    Frederico Hamman 10/25/2022, 3:29 AM

## 2022-10-26 ENCOUNTER — Encounter (HOSPITAL_COMMUNITY): Payer: Self-pay | Admitting: Family Medicine

## 2022-10-26 ENCOUNTER — Other Ambulatory Visit (HOSPITAL_COMMUNITY): Payer: Self-pay

## 2022-10-26 MED ORDER — SENNOSIDES-DOCUSATE SODIUM 8.6-50 MG PO TABS
2.0000 | ORAL_TABLET | ORAL | 0 refills | Status: DC
  Filled 2022-10-26: qty 30, 15d supply, fill #0

## 2022-10-26 MED ORDER — NIFEDIPINE ER 30 MG PO TB24
30.0000 mg | ORAL_TABLET | Freq: Every day | ORAL | 3 refills | Status: DC
  Filled 2022-10-26: qty 30, 30d supply, fill #0

## 2022-10-26 MED ORDER — BENZOCAINE-MENTHOL 20-0.5 % EX AERO
1.0000 | INHALATION_SPRAY | CUTANEOUS | 3 refills | Status: DC | PRN
  Filled 2022-10-26: qty 78, fill #0

## 2022-10-26 MED ORDER — FUROSEMIDE 20 MG PO TABS
20.0000 mg | ORAL_TABLET | Freq: Every day | ORAL | 0 refills | Status: DC
  Filled 2022-10-26: qty 5, 5d supply, fill #0

## 2022-10-26 NOTE — Anesthesia Postprocedure Evaluation (Signed)
Anesthesia Post Note  Patient: Joann Reid  Procedure(s) Performed: AN AD HOC LABOR EPIDURAL     Patient location during evaluation: Mother Baby Anesthesia Type: Epidural Level of consciousness: awake and alert and oriented Pain management: satisfactory to patient Vital Signs Assessment: post-procedure vital signs reviewed and stable Respiratory status: respiratory function stable Cardiovascular status: stable Postop Assessment: no headache, no backache, epidural receding, patient able to bend at knees, no signs of nausea or vomiting, adequate PO intake and able to ambulate Anesthetic complications: no   No notable events documented.  Last Vitals:  Vitals:   10/25/22 2050 10/26/22 0556  BP: 134/82 133/89  Pulse: 86 92  Resp: 20 18  Temp: (!) 36.2 C 36.5 C  SpO2: 100%     Last Pain:  Vitals:   10/26/22 0556  TempSrc: Oral  PainSc: 0-No pain   Pain Goal: Patients Stated Pain Goal: 2 (10/25/22 2050)                 Karleen Dolphin

## 2022-10-26 NOTE — Lactation Note (Signed)
This note was copied from a baby's chart. Lactation Consultation Note  Patient Name: Joann Reid PNTIR'W Date: 10/26/2022 Reason for consult: Follow-up assessment Age:21 hours   P1: Early term infant at 38+6 weeks Feeding preference: Breast/formula Weight loss: 2%  Mother has been breast feeding and supplementing with formula; baby has been consuming adequate volumes.  She had no questions related to breast feeding.  Provided coconut oil for nipple care.  Mother appreciative. Baby is voiding/stooling.  Mother has our op phone number for any concerns after discharge. No support person present at this time.   Maternal Data    Feeding    LATCH Score                    Lactation Tools Discussed/Used    Interventions    Discharge Discharge Education: Engorgement and breast care  Consult Status Consult Status: Complete Date: 10/26/22 Follow-up type: Call as needed    Gurney Balthazor R Sheral Pfahler 10/26/2022, 10:53 AM

## 2022-10-26 NOTE — Social Work (Signed)
CSW received consult for hx of Depression.  CSW met with MOB to offer support and complete assessment. CSW entered the room, observed MOB resting on side of bed and infant lying comfortably in the bassinet. CSW introduced self, CSW role and reason for visit. MOB was agreeable to visit. CSW observed MOB showing signs of discomfort and inquired about how MOB was feeling, MOB reported okay, I just do not like taking pills. MOB reported her delivery went well and stated "the epidural was my best friend".   CSW inquired about MOB MH hx, MOB reported she was diagnosed in 2020 following the passing of her grandmother. CSW inquired about treatment,. MOB reported she was prescribed mediation and picked it up but never took the medication. MOB reported she was seeing a therapist during her 2nd trimester but did not find it beneficial.CSW inquired about how MOB coped with depression, MOB reported reading her baby book has helped with her depression symptoms. CSW assessed for safety, MOB denied any SI or HI. MOB identified her mom, sister and closest friend as her supports. CSW provided education regarding the baby blues period vs. perinatal mood disorders, discussed treatment and gave resources for mental health follow up if concerns arise.  CSW recommends self-evaluation during the postpartum time period using the New Mom Checklist from Postpartum Progress and encouraged MOB to contact a medical professional if symptoms are noted at any time.    CSW provided review of Sudden Infant Death Syndrome (SIDS) precautions. MOB identified [facility] for infants follow up care. MOB reported she has all necessary items for the infant including a bassinet for her to sleep.  CSW identifies no further need for intervention and no barriers to discharge at this time.  Letta Kocher, [place] Social Worker [phone]

## 2022-10-27 ENCOUNTER — Encounter: Payer: Medicaid Other | Admitting: Obstetrics and Gynecology

## 2022-11-02 ENCOUNTER — Encounter: Payer: Self-pay | Admitting: Obstetrics & Gynecology

## 2022-11-02 ENCOUNTER — Ambulatory Visit (INDEPENDENT_AMBULATORY_CARE_PROVIDER_SITE_OTHER): Payer: Medicaid Other | Admitting: Obstetrics & Gynecology

## 2022-11-02 VITALS — BP 133/85 | HR 120 | Wt 206.0 lb

## 2022-11-02 DIAGNOSIS — Z30018 Encounter for initial prescription of other contraceptives: Secondary | ICD-10-CM | POA: Diagnosis not present

## 2022-11-02 DIAGNOSIS — O133 Gestational [pregnancy-induced] hypertension without significant proteinuria, third trimester: Secondary | ICD-10-CM

## 2022-11-02 MED ORDER — AMLODIPINE BESYLATE 5 MG PO TABS: 5.0000 mg | ORAL_TABLET | Freq: Every day | ORAL | 0 refills | Status: DC

## 2022-11-02 MED ORDER — SLYND 4 MG PO TABS: 1.0000 | ORAL_TABLET | Freq: Every day | ORAL | 4 refills | Status: DC

## 2022-11-02 NOTE — Progress Notes (Signed)
Pt states IUD fell out but does not want a replacement.

## 2022-11-02 NOTE — Progress Notes (Signed)
Pt c/o headaches every day. Tylenol does not help. Pt denies visual changes.   Pt scored 1 on Edinburgh postpartum depression screening

## 2022-11-02 NOTE — Progress Notes (Signed)
    Postpartum Visit Note  Joann Reid is a 21 y.o. G16P1001 female who presents for a postoperative visit. She is 1 week postop following a NSVD with IUD insertion.  Complicated by Gestational HTN  S/p Lasix, taking Procardia Notes 8/10 headache daily.  No change in her vision.  No improvement with tylenol .   Still bleeding- not heavy.  No other acute complaints  IUD fell out when she was using the bathroom.  Wishes to transition to another option  Edinburgh Postnatal Depression Scale - 11/02/22 1508       Edinburgh Postnatal Depression Scale:  In the Past 7 Days   I have been able to laugh and see the funny side of things. 0    I have looked forward with enjoyment to things. 0    I have blamed myself unnecessarily when things went wrong. 0    I have been anxious or worried for no good reason. 0    I have felt scared or panicky for no good reason. 0    Things have been getting on top of me. 0    I have been so unhappy that I have had difficulty sleeping. 0    I have felt sad or miserable. 0    I have been so unhappy that I have been crying. 1    The thought of harming myself has occurred to me. 0    Edinburgh Postnatal Depression Scale Total 1              Review of Systems Pertinent items are noted in HPI.    Objective:  BP 133/85   Pulse (!) 120   Wt 206 lb (93.4 kg)   LMP 01/14/2022   BMI 34.28 kg/m    Physical Examination:  GENERAL ASSESSMENT: well developed and well nourished, seems fatigued SKIN: warm and dry CHEST: normal air exchange, respiratory effort normal with no retractions HEART: regular rate and rhythm PSYCH: mood appropriate, normal affect       Assessment:    1wk postpartum check  headache  Plan:  -Bp appropriate, change from Procardia to Norvasc. -reviewed preeclampsia precautions -follow up in 1wk for BP check -reviewed all contraceptive options- plans for POPs for now, may consider Nexplanon  Meds ordered this encounter   Medications   amLODipine (NORVASC) 5 MG tablet    Sig: Take 1 tablet (5 mg total) by mouth daily.    Dispense:  30 tablet    Refill:  0   Drospirenone (SLYND) 4 MG TABS    Sig: Take 1 tablet by mouth daily.    Dispense:  90 tablet    Refill:  4    Myna Hidalgo, DO Attending Obstetrician & Gynecologist, [facility] for [employer], [facility]

## 2022-11-09 ENCOUNTER — Ambulatory Visit: Payer: Medicaid Other | Admitting: Obstetrics and Gynecology

## 2022-11-09 ENCOUNTER — Encounter: Payer: Self-pay | Admitting: Obstetrics and Gynecology

## 2022-11-09 VITALS — BP 116/78 | HR 69 | Ht 65.0 in | Wt 204.0 lb

## 2022-11-09 DIAGNOSIS — O133 Gestational [pregnancy-induced] hypertension without significant proteinuria, third trimester: Secondary | ICD-10-CM

## 2022-11-09 NOTE — Progress Notes (Signed)
    Post Delivery BP Check  Joann Reid is a 21 y.o. G1P1001 (Patient's last menstrual period was 01/14/2022.) who is s/p SVD on 10/25/22 after induction of labor for gestational hypertension. Started on procardia post delivery, changed to norvasc 11/02/22 for headache and suboptimal control. She reports doing much better, no headache today.   Complains of lack of sleep with infant, otherwise feeling well.   Physical Exam:  BP 116/78   Pulse 69   Ht 5\' 5"  (1.651 m)   Wt 204 lb (92.5 kg)   LMP 01/14/2022   BMI 33.95 kg/m  Body mass index is 33.95 kg/m. General appearance: Well nourished, well developed female in no acute distress.    Assessment: Patient is a 21 y.o. G1P1001 who is 2 weeks post partum from a SVD. She is doing well. BP much better controlled on norvasc. Cont norvasc   Plan:  Follow up 2 weeks for post partum visit.   36, MD, [facility] Attending Center for [facility])

## 2022-11-09 NOTE — Progress Notes (Signed)
Error

## 2022-12-03 NOTE — Progress Notes (Deleted)
[place] Partum Exam  Joann Reid is a 22 y.o. G41P1001 female who presents for a postpartum visit. She is 6 weeks postpartum following a spontaneous vaginal delivery. I have fully reviewed the prenatal and intrapartum course. The delivery was at 38.6 gestational weeks.  Anesthesia: epidural. Postpartum course has been ***. Baby's course has been ***. Baby is feeding by {breast/bottle:69}. Bleeding {vag bleed:12292}. Bowel function is {normal:32111}. Bladder function is {normal:32111}. Patient {is/is not:9024} sexually active. Contraception method is IUD. Postpartum depression screening:neg  {Common ambulatory SmartLinks:19316}  Review of Systems {ros; complete:30496}    Objective:    BP 116/78 mmHg  Pulse 78  Resp 16  Ht 5\' 5"  (1.651 m)  Wt 211 lb (95.709 kg)  BMI 35.11 kg/m2  Breastfeeding? Yes  General:  {gen appearance:16600}   Breasts:  {breast exam:1202::"inspection negative, no nipple discharge or bleeding, no masses or nodularity palpable"}  Lungs: {lung exam:16931}  Heart:  {heart exam:5510}  Abdomen: {abdomen exam:16834}   Vulva:  {labia exam:12198}  Vagina: {vagina exam:12200}  Cervix:  {cervix exam:14595}  Corpus: {uterus exam:12215}  Adnexa:  {adnexa exam:12223}  Rectal Exam: {rectal/vaginal exam:12274}        Assessment:    *** postpartum exam. Pap smear {done:10129} at today's visit.   Plan:   1. Contraception: {method:5051} 2. *** 3. Follow up in: {1-10:13787} {time; units:19136} or as needed.

## 2022-12-07 ENCOUNTER — Ambulatory Visit: Payer: Medicaid Other | Admitting: Obstetrics & Gynecology

## 2022-12-14 NOTE — Progress Notes (Signed)
[place] Partum Visit Note  Joann Reid is a 22 y.o. G56P1001 female who presents for a postpartum visit. She is 7 weeks postpartum following a normal spontaneous vaginal delivery.  I have fully reviewed the prenatal and intrapartum course. The delivery was at 3839 gestational weeks.  Anesthesia: epidural. Postpartum course has been unremarkable. Baby is doing well. Baby is feeding by bottle - Jerlyn Ly Start . Bleeding no bleeding. Bowel function is normal. Bladder function is normal. Patient is not sexually active. Contraception method is undecided. Postpartum depression screening: negative.   The pregnancy intention screening data noted above was reviewed. Potential methods of contraception were discussed. The patient elected to proceed with No data recorded.   Edinburgh Postnatal Depression Scale - 12/15/22 1343       Edinburgh Postnatal Depression Scale:  In the Past 7 Days   I have been able to laugh and see the funny side of things. 0    I have looked forward with enjoyment to things. 0    I have blamed myself unnecessarily when things went wrong. 0    I have been anxious or worried for no good reason. 0    I have felt scared or panicky for no good reason. 0    Things have been getting on top of me. 0    I have been so unhappy that I have had difficulty sleeping. 0    I have felt sad or miserable. 0    I have been so unhappy that I have been crying. 0    The thought of harming myself has occurred to me. 0    Edinburgh Postnatal Depression Scale Total 0             Health Maintenance Due  Topic Date Due   HPV VACCINES (1 - 2-dose series) Never done   INFLUENZA VACCINE  06/30/2022   COVID-19 Vaccine (3 - 2023-24 season) 07/31/2022    The following portions of the patient's history were reviewed and updated as appropriate: allergies, current medications, past family history, past medical history, past social history, past surgical history, and problem  list.  Review of Systems Pertinent items are noted in HPI.  Objective:  BP 114/72   Pulse 73   Resp 16   Ht 5\' 5"  (1.651 m)   Wt 203 lb (92.1 kg)   LMP 01/14/2022   Breastfeeding No   BMI 33.78 kg/m    General:  alert and cooperative   Breasts:  not indicated  Lungs: clear to auscultation bilaterally  Heart:  regular rate and rhythm, S1, S2 normal, no murmur, click, rub or gallop       Assessment:    Normal postpartum exam.  Plans to return for annual and nexplanon placement.   Plan:   Essential components of care per ACOG recommendations:  1.  Mood and well being: Patient with negative depression screening today. Reviewed local resources for support.  - Patient tobacco use? No.   - hx of drug use? No.    2. Infant care and feeding:  -Patient currently breastmilk feeding? No.  -Social determinants of health (SDOH) reviewed in EPIC. No concerns  3. Sexuality, contraception and birth spacing - Patient does not want a pregnancy in the next year.  Desired family size is 1 children.  - Reviewed reproductive life planning. Reviewed contraceptive methods based on pt preferences and effectiveness.  Patient desired Hormonal Implant today.   - Discussed birth spacing of 18 months  4. Sleep and fatigue -Encouraged family/partner/community support of 4 hrs of uninterrupted sleep to help with mood and fatigue  5. Physical Recovery  - Discussed patients delivery and complications. She describes her labor as good. - Patient had a Vaginal, no problems at delivery. Patient had a 2nd degree laceration. Perineal healing reviewed. Patient expressed understanding - Patient has urinary incontinence? No. - Patient is safe to resume physical and sexual activity  6.  Health Maintenance - HM due items addressed Yes - Last pap smear  Diagnosis  Date Value Ref Range Status  04/20/2022   Final   - Negative for intraepithelial lesion or malignancy (NILM)   Pap smear not done at today's  visit.  -Breast Cancer screening indicated? No.   7. Chronic Disease/Pregnancy Condition follow up: Hypertension  - PCP follow up  Noni Saupe, NP Center for [employer], [place]

## 2022-12-15 ENCOUNTER — Encounter: Payer: Self-pay | Admitting: Obstetrics and Gynecology

## 2022-12-15 ENCOUNTER — Ambulatory Visit (INDEPENDENT_AMBULATORY_CARE_PROVIDER_SITE_OTHER): Payer: Medicaid Other | Admitting: Obstetrics and Gynecology

## 2022-12-22 ENCOUNTER — Ambulatory Visit (INDEPENDENT_AMBULATORY_CARE_PROVIDER_SITE_OTHER): Payer: Medicaid Other | Admitting: Obstetrics and Gynecology

## 2022-12-22 ENCOUNTER — Encounter: Payer: Self-pay | Admitting: Obstetrics and Gynecology

## 2022-12-22 VITALS — BP 118/73 | HR 66 | Resp 16 | Ht 65.0 in | Wt 197.0 lb

## 2022-12-22 DIAGNOSIS — Z30017 Encounter for initial prescription of implantable subdermal contraceptive: Secondary | ICD-10-CM | POA: Diagnosis not present

## 2022-12-22 LAB — POCT URINE PREGNANCY: Preg Test, Ur: NEGATIVE

## 2022-12-22 MED ORDER — ETONOGESTREL 68 MG ~~LOC~~ IMPL
68.0000 mg | DRUG_IMPLANT | Freq: Once | SUBCUTANEOUS | Status: AC
  Administered 2022-12-22: 68 mg via SUBCUTANEOUS

## 2022-12-22 NOTE — Progress Notes (Signed)
Deferring pap due to being on cycle today

## 2022-12-22 NOTE — Progress Notes (Signed)
     GYNECOLOGY OFFICE PROCEDURE NOTE  Joann Reid is a 22 y.o. G1P1001 here for Nexplanon insertion.  Last pap smear was on NA. 22 years old, due for PAP. She is on her cycle now and reschedule for PaP.  Nexplanon Insertion Procedure Patient identified, informed consent performed, consent signed.   Patient does understand that irregular bleeding is a very common side effect of this medication. She was advised to have backup contraception for one week after placement. Pregnancy test in clinic today was negative.  Appropriate time out taken.  Patient's left arm was prepped and draped in the usual sterile fashion. The ruler used to measure and mark insertion area.  Patient was prepped with alcohol swab and then injected with 3 ml of 1% lidocaine.  She was prepped with betadine, Nexplanon removed from packaging,  Device confirmed in needle, then inserted full length of needle and withdrawn per handbook instructions. Nexplanon was able to palpated in the patient's arm; patient palpated the insert herself. There was minimal blood loss.  Patient insertion site covered with guaze and a pressure bandage to reduce any bruising.  The patient tolerated the procedure well and was given post procedure instructions.   [name], Artist Pais, [place] for [employer], [place]

## 2023-01-05 ENCOUNTER — Other Ambulatory Visit (HOSPITAL_COMMUNITY): Payer: Self-pay

## 2023-02-01 ENCOUNTER — Ambulatory Visit: Payer: Medicaid Other | Admitting: Obstetrics and Gynecology

## 2023-02-01 ENCOUNTER — Encounter: Payer: Self-pay | Admitting: Obstetrics and Gynecology

## 2023-02-01 VITALS — BP 111/66 | HR 70 | Resp 16 | Ht 65.0 in | Wt 194.0 lb

## 2023-02-01 DIAGNOSIS — L659 Nonscarring hair loss, unspecified: Secondary | ICD-10-CM | POA: Diagnosis not present

## 2023-02-01 DIAGNOSIS — Z3046 Encounter for surveillance of implantable subdermal contraceptive: Secondary | ICD-10-CM

## 2023-02-01 DIAGNOSIS — M79622 Pain in left upper arm: Secondary | ICD-10-CM

## 2023-02-01 NOTE — Progress Notes (Signed)
   GYNECOLOGY OFFICE PROCEDURE NOTE  Joann Reid is a 22 y.o. G1P1001 here for Nexplanon removal. Has pain/discomfort around the insertion site and is worried it's causing hair loss. Last pap smear was on 04/20/22 and was normal.  No other gynecologic concerns.  Nexplanon Removal Patient identified, informed consent performed, consent signed.   Appropriate time out taken.   Nexplanon site identified.  Area prepped in usual sterile fashon. 1.5 ml of 1% lidocaine was used to anesthetize the area at the distal end of the implant. A small stab incision was made right beside the implant on the distal portion.  The Nexplanon rod was removed using the pop out technique. There was minimal blood loss. There were no complications.  Steri-strips were applied over the small incision.  A pressure bandage was applied to reduce any bruising.    The patient tolerated the procedure well and was given post procedure instructions. She was counseled that she is not currently protected against pregnancy now that Nexplanon is removed. She plans abstinence and is considering IUD placement.   Gale Journey, MD [place], [facility] for [employer], [place]

## 2023-02-01 NOTE — Patient Instructions (Addendum)
[email or web address]  Please call us if you have any concerns about your nexplanon removal.

## 2023-02-02 ENCOUNTER — Ambulatory Visit: Payer: Medicaid Other | Admitting: Obstetrics and Gynecology

## 2023-02-02 ENCOUNTER — Ambulatory Visit
Admission: RE | Admit: 2023-02-02 | Discharge: 2023-02-02 | Disposition: A | Discharge status: Home / Self Care | Payer: Medicaid Other | Source: Ambulatory Visit | Attending: Emergency Medicine | Admitting: Emergency Medicine

## 2023-02-02 VITALS — BP 123/79 | HR 82 | Temp 99.0°F | Resp 18 | Ht 65.0 in | Wt 194.0 lb

## 2023-02-02 DIAGNOSIS — Z3202 Encounter for pregnancy test, result negative: Secondary | ICD-10-CM

## 2023-02-02 DIAGNOSIS — J029 Acute pharyngitis, unspecified: Secondary | ICD-10-CM

## 2023-02-02 LAB — POCT RAPID STREP A (OFFICE): Rapid Strep A Screen: NEGATIVE

## 2023-02-02 LAB — POCT URINE PREGNANCY: Preg Test, Ur: NEGATIVE

## 2023-02-02 MED ORDER — IBUPROFEN 800 MG PO TABS: 800.0000 mg | ORAL_TABLET | Freq: Three times a day (TID) | ORAL | 0 refills | Status: DC

## 2023-02-02 MED ORDER — LIDOCAINE VISCOUS HCL 2 % MT SOLN: 15.0000 mL | OROMUCOSAL | 0 refills | Status: DC | PRN

## 2023-02-02 NOTE — Discharge Instructions (Signed)
We will call you if anything grows on your throat culture.  Please treat your pain with symptomatic care I recommend ibuprofen every 6 hours. You can use the lidocaine GARGLE and SPIT as often as needed for numbing

## 2023-02-02 NOTE — ED Provider Notes (Signed)
[facility]    CSN: [id number] Arrival date & time: 02/02/23  1903      History   Chief Complaint Chief Complaint  Patient presents with   Sore Throat    I'm pretty sure I have strep throat, I get is 4 times out the year and it's time for me to get it again. It's hurting when I eat and drinking. - Entered by patient    HPI Joann Reid is a 22 y.o. female.  Here with sore throat x 2 weeks Reports 10/10 pain. Has not taken any pain medications apart from two leftover penicillin  No fevers Reports history of strep  LMP 1/1  She had Nexplanon placed 1/23 and removed yesterday 4 months PPM. Not breastfeeding   Past Medical History:  Diagnosis Date   Asthma     Patient Active Problem List   Diagnosis Date Noted   IUD (intrauterine device) in place 10/25/2022   Hypokalemia 10/24/2022   Vaginal delivery 10/23/2022   Depression affecting pregnancy 08/11/2022   Obesity affecting pregnancy 06/19/2022   Supervision of normal first pregnancy 04/20/2022   IBS (irritable bowel syndrome) 10/30/2021   Moderate persistent asthma 08/19/2020   Nonintractable episodic headache 01/26/2020    Past Surgical History:  Procedure Laterality Date   EYE SURGERY     IUD INSERTION  10/25/2022    OB History     Gravida  1   Para  1   Term  1   Preterm      AB      Living  1      SAB      IAB      Ectopic      Multiple  0   Live Births  1            Home Medications    Prior to Admission medications   Medication Sig Start Date End Date Taking? Authorizing Provider  ibuprofen (ADVIL) 800 MG tablet Take 1 tablet (800 mg total) by mouth 3 (three) times daily. 02/02/23  Yes Miku Udall, PA-C  lidocaine (XYLOCAINE) 2 % solution Use as directed 15 mLs in the mouth or throat every hour as needed for mouth pain. 02/02/23  Yes Sahirah Rudell, Wells Guiles, PA-C  Prenatal Vit-Fe Fumarate-FA (PRENATAL PO) Take 1 tablet by mouth daily.   Yes [provider]    Family History Family History  Problem Relation Age of Onset   Hypertension Mother    Diabetes Maternal Grandmother     Social History Social History   Tobacco Use   Smoking status: Never   Smokeless tobacco: Never  Vaping Use   Vaping Use: Never used  Substance Use Topics   Alcohol use: Never   Drug use: Never     Allergies   Norco [hydrocodone-acetaminophen]   Review of Systems Review of Systems Per HPI  Physical Exam Triage Vital Signs ED Triage Vitals  Enc Vitals Group     BP 02/02/23 1914 123/79     Pulse Rate 02/02/23 1914 82     Resp 02/02/23 1914 18     Temp 02/02/23 1914 99 F (37.2 C)     Temp Source 02/02/23 1914 Oral     SpO2 02/02/23 1914 97 %     Weight 02/02/23 1916 194 lb (88 kg)     Height 02/02/23 1916 '5\' 5"'$  (1.651 m)     Head Circumference --      Peak Flow --  Pain Score 02/02/23 1915 10     Pain Loc --      Pain Edu? --      Excl. in [place]? --    No data found.  Updated Vital Signs BP 123/79 (BP Location: Right Arm)   Pulse 82   Temp 99 F (37.2 C) (Oral)   Resp 18   Ht '5\' 5"'$  (1.651 m)   Wt 194 lb (88 kg)   LMP 11/30/2022   SpO2 97%   Breastfeeding No   BMI 32.28 kg/m    Physical Exam Vitals and nursing note reviewed.  Constitutional:      General: She is not in acute distress.    Appearance: She is not ill-appearing.  HENT:     Mouth/Throat:     Mouth: Mucous membranes are moist.     Pharynx: Oropharynx is clear. Posterior oropharyngeal erythema present. No oropharyngeal exudate.     Tonsils: No tonsillar exudate or tonsillar abscesses.  Eyes:     Conjunctiva/sclera: Conjunctivae normal.  Cardiovascular:     Rate and Rhythm: Normal rate and regular rhythm.     Heart sounds: Normal heart sounds.  Pulmonary:     Effort: Pulmonary effort is normal.     Breath sounds: Normal breath sounds.  Musculoskeletal:     Cervical back: Normal range of motion.  Skin:    General: Skin is warm and dry.   Neurological:     Mental Status: She is alert and oriented to person, place, and time.     UC Treatments / Results  Labs (all labs ordered are listed, but only abnormal results are displayed) Labs Reviewed  CULTURE, GROUP A STREP [facility])  POCT RAPID STREP A (OFFICE)  POCT URINE PREGNANCY    EKG   Radiology No results found.  Procedures Procedures (including critical care time)  Medications Ordered in UC Medications - No data to display  Initial Impression / Assessment and Plan / UC Course  I have reviewed the triage vital signs and the nursing notes.  Pertinent labs & imaging results that were available during my care of the patient were reviewed by me and considered in my medical decision making (see chart for details).  UPT negative Strep test negative, culture pending. Recommend symptomatic care with ibu and lidocaine gargle for pain. Discussed will call if culture grows any bacteria requiring abx prescrip Work note provided  Final Clinical Impressions(s) / UC Diagnoses   Final diagnoses:  Sore throat     Discharge Instructions      We will call you if anything grows on your throat culture.  Please treat your pain with symptomatic care I recommend ibuprofen every 6 hours. You can use the lidocaine GARGLE and SPIT as often as needed for numbing     ED Prescriptions     Medication Sig Dispense Auth. Provider   ibuprofen (ADVIL) 800 MG tablet Take 1 tablet (800 mg total) by mouth 3 (three) times daily. 21 tablet Cobin Cadavid, PA-C   lidocaine (XYLOCAINE) 2 % solution Use as directed 15 mLs in the mouth or throat every hour as needed for mouth pain. 100 mL Yardley Beltran, Wells Guiles, PA-C      PDMP not reviewed this encounter.   Les Pou, [place] 02/02/23 [phone]

## 2023-02-02 NOTE — ED Triage Notes (Signed)
Patient c/o sore throat x 2 weeks, chest congestion.  Patient feels that she has strep throat.  Denies any OTC cold/pain meds.

## 2023-02-06 LAB — CULTURE, GROUP A STREP (THRC)

## 2023-02-20 ENCOUNTER — Ambulatory Visit
Admission: RE | Admit: 2023-02-20 | Discharge: 2023-02-20 | Disposition: A | Discharge status: Home / Self Care | Payer: Medicaid Other | Source: Ambulatory Visit | Attending: Family Medicine | Admitting: Family Medicine

## 2023-02-20 ENCOUNTER — Other Ambulatory Visit: Payer: Self-pay

## 2023-02-20 VITALS — BP 117/78 | HR 84 | Temp 99.6°F | Resp 16 | Ht 65.0 in | Wt 194.0 lb

## 2023-02-20 DIAGNOSIS — K122 Cellulitis and abscess of mouth: Secondary | ICD-10-CM | POA: Diagnosis not present

## 2023-02-20 MED ORDER — AMOXICILLIN 875 MG PO TABS: 875.0000 mg | ORAL_TABLET | Freq: Two times a day (BID) | ORAL | 0 refills | Status: AC

## 2023-02-20 NOTE — ED Triage Notes (Signed)
Continues w/ sore throat  from 02/02/23 Hurts to swallow Ibuprofen 800mg   daily  No meds today  Did not use  throat spray  R ear pain w/ sinus  drainage

## 2023-02-20 NOTE — ED Provider Notes (Signed)
[facility]    CSN: [id number] Arrival date & time: 02/20/23  0940      History   Chief Complaint Chief Complaint  Patient presents with   Sore Throat    HPI Joann Reid is a 22 y.o. female.   HPI Very pleasant 22 year old female presents with continued sore throat pain.  Reports was initially evaluated here on 02/02/23 advised to take ibuprofen 800 mg and lidocaine gel.  Additionally, reports right ear pain with sinus drainage.  PMH significant for obesity, IBS, and moderate persistent asthma.  Past Medical History:  Diagnosis Date   Asthma     Patient Active Problem List   Diagnosis Date Noted   IUD (intrauterine device) in place 10/25/2022   Hypokalemia 10/24/2022   Vaginal delivery 10/23/2022   Depression affecting pregnancy 08/11/2022   Obesity affecting pregnancy 06/19/2022   Supervision of normal first pregnancy 04/20/2022   IBS (irritable bowel syndrome) 10/30/2021   Moderate persistent asthma 08/19/2020   Nonintractable episodic headache 01/26/2020    Past Surgical History:  Procedure Laterality Date   EYE SURGERY     IUD INSERTION  10/25/2022    OB History     Gravida  1   Para  1   Term  1   Preterm      AB      Living  1      SAB      IAB      Ectopic      Multiple  0   Live Births  1            Home Medications    Prior to Admission medications   Medication Sig Start Date End Date Taking? Authorizing Provider  amoxicillin (AMOXIL) 875 MG tablet Take 1 tablet (875 mg total) by mouth 2 (two) times daily for 7 days. 02/20/23 02/27/23 Yes Eliezer Lofts, FNP  ibuprofen (ADVIL) 800 MG tablet Take 1 tablet (800 mg total) by mouth 3 (three) times daily. Patient not taking: Reported on 02/20/2023 02/02/23   Rising, Wells Guiles, PA-C  lidocaine (XYLOCAINE) 2 % solution Use as directed 15 mLs in the mouth or throat every hour as needed for mouth pain. Patient not taking: Reported on 02/20/2023 02/02/23   Rising, Wells Guiles,  PA-C  Prenatal Vit-Fe Fumarate-FA (PRENATAL PO) Take 1 tablet by mouth daily.    [provider]    Family History Family History  Problem Relation Age of Onset   Hypertension Mother    Diabetes Maternal Grandmother     Social History Social History   Tobacco Use   Smoking status: Never   Smokeless tobacco: Never  Vaping Use   Vaping Use: Never used  Substance Use Topics   Alcohol use: Never   Drug use: Never     Allergies   Norco [hydrocodone-acetaminophen]   Review of Systems Review of Systems  HENT:  Positive for sore throat.   All other systems reviewed and are negative.    Physical Exam Triage Vital Signs ED Triage Vitals [02/20/23 0951]  Enc Vitals Group     BP 117/78     Pulse Rate 84     Resp 16     Temp 99.6 F (37.6 C)     Temp Source Oral     SpO2 98 %     Weight      Height      Head Circumference      Peak Flow  Pain Score      Pain Loc      Pain Edu?      Excl. in [place]?    No data found.  Updated Vital Signs BP 117/78 (BP Location: Right Arm)   Pulse 84   Temp 99.6 F (37.6 C) (Oral)   Resp 16   Ht 5\' 5"  (1.651 m)   Wt 194 lb (88 kg)   LMP 02/01/2023 (Exact Date)   SpO2 98%   Breastfeeding No   BMI 32.28 kg/m       Physical Exam Vitals and nursing note reviewed.  Constitutional:      Appearance: Normal appearance. She is well-developed. She is obese. She is ill-appearing.  HENT:     Head: Normocephalic and atraumatic.     Right Ear: Tympanic membrane, ear canal and external ear normal.     Left Ear: Tympanic membrane, ear canal and external ear normal.     Mouth/Throat:     Mouth: Mucous membranes are moist.     Pharynx: Uvula midline. Pharyngeal swelling, posterior oropharyngeal erythema and uvula swelling present.     Tonsils: 3+ on the right. 3+ on the left.  Eyes:     Extraocular Movements: Extraocular movements intact.     Conjunctiva/sclera: Conjunctivae normal.     Pupils: Pupils are equal, round,  and reactive to light.  Cardiovascular:     Rate and Rhythm: Normal rate and regular rhythm.     Pulses: Normal pulses.     Heart sounds: Normal heart sounds.  Pulmonary:     Effort: Pulmonary effort is normal.     Breath sounds: Normal breath sounds. No wheezing, rhonchi or rales.  Musculoskeletal:        General: Normal range of motion.     Cervical back: Normal range of motion and neck supple.  Skin:    General: Skin is warm and dry.  Neurological:     General: No focal deficit present.     Mental Status: She is alert and oriented to person, place, and time.      UC Treatments / Results  Labs (all labs ordered are listed, but only abnormal results are displayed) Labs Reviewed - No data to display  EKG   Radiology No results found.  Procedures Procedures (including critical care time)  Medications Ordered in UC Medications - No data to display  Initial Impression / Assessment and Plan / UC Course  I have reviewed the triage vital signs and the nursing notes.  Pertinent labs & imaging results that were available during my care of the patient were reviewed by me and considered in my medical decision making (see chart for details).     MDM: 1.  Uvulitis-Rx'd Amoxicillin 875 mg twice daily for 7 days. Instructed patient to take medication as directed with food to completion.  Encouraged patient to increase daily water intake to 64 ounces per day while taking this medication.  Advised if symptoms worsen and/or unresolved please follow-up with PCP or here for further evaluation.  Patient discharged home, hemodynamically stable.  Final Clinical Impressions(s) / UC Diagnoses   Final diagnoses:  Uvulitis     Discharge Instructions      Instructed patient to take medication as directed with food to completion.  Encouraged patient to increase daily water intake to 64 ounces per day while taking this medication.  Advised if symptoms worsen and/or unresolved please  follow-up with PCP or here for further evaluation.  ED Prescriptions     Medication Sig Dispense Auth. Provider   amoxicillin (AMOXIL) 875 MG tablet Take 1 tablet (875 mg total) by mouth 2 (two) times daily for 7 days. 14 tablet Eliezer Lofts, FNP      PDMP not reviewed this encounter.   Eliezer Lofts, [place] 02/20/23 1018

## 2023-02-20 NOTE — Discharge Instructions (Addendum)
Instructed patient to take medication as directed with food to completion.  Encouraged patient to increase daily water intake to 64 ounces per day while taking this medication.  Advised if symptoms worsen and/or unresolved please follow-up with PCP or here for further evaluation. 

## 2023-03-09 ENCOUNTER — Ambulatory Visit
Admission: RE | Admit: 2023-03-09 | Discharge: 2023-03-09 | Disposition: A | Discharge status: Home / Self Care | Payer: Medicaid Other | Source: Ambulatory Visit | Attending: Family Medicine | Admitting: Family Medicine

## 2023-03-09 VITALS — BP 117/76 | HR 73 | Temp 98.9°F | Resp 16 | Ht 65.0 in | Wt 190.0 lb

## 2023-03-09 DIAGNOSIS — H60502 Unspecified acute noninfective otitis externa, left ear: Secondary | ICD-10-CM

## 2023-03-09 MED ORDER — NEOMYCIN-POLYMYXIN-HC 3.5-10000-1 OT SUSP: 3.0000 [drp] | Freq: Three times a day (TID) | OTIC | 0 refills | Status: DC

## 2023-03-09 NOTE — ED Triage Notes (Signed)
Patient c/o left sided ear pain x 2 days.  Patient feels like it's water in the ear.  Tried OTC ear drops w/o relief.

## 2023-03-09 NOTE — ED Provider Notes (Signed)
[facility]    CSN: [id number] Arrival date & time: 03/09/23  1908      History   Chief Complaint Chief Complaint  Patient presents with   Ear Fullness    Entered by patient    HPI Joann Reid is a 22 y.o. female.   HPI  Ear pain and pressure on the left for the last 2 days.  Feels like she is underwater.  No cough cold runny nose sinus congestion.  No history of ear infections  Past Medical History:  Diagnosis Date   Asthma     Patient Active Problem List   Diagnosis Date Noted   IUD (intrauterine device) in place 10/25/2022   Hypokalemia 10/24/2022   Vaginal delivery 10/23/2022   Depression affecting pregnancy 08/11/2022   Obesity affecting pregnancy 06/19/2022   Supervision of normal first pregnancy 04/20/2022   IBS (irritable bowel syndrome) 10/30/2021   Moderate persistent asthma 08/19/2020   Nonintractable episodic headache 01/26/2020    Past Surgical History:  Procedure Laterality Date   EYE SURGERY     IUD INSERTION  10/25/2022    OB History     Gravida  1   Para  1   Term  1   Preterm      AB      Living  1      SAB      IAB      Ectopic      Multiple  0   Live Births  1            Home Medications    Prior to Admission medications   Medication Sig Start Date End Date Taking? Authorizing Provider  neomycin-polymyxin-hydrocortisone (CORTISPORIN) 3.5-10000-1 OTIC suspension Place 3 drops into the left ear 3 (three) times daily. 03/09/23  Yes Eustace Moore, MD  Prenatal Vit-Fe Fumarate-FA (PRENATAL PO) Take 1 tablet by mouth daily.   Yes [provider]    Family History Family History  Problem Relation Age of Onset   Hypertension Mother    Diabetes Maternal Grandmother     Social History Social History   Tobacco Use   Smoking status: Never   Smokeless tobacco: Never  Vaping Use   Vaping Use: Never used  Substance Use Topics   Alcohol use: Never   Drug use: Never      Allergies   Norco [hydrocodone-acetaminophen]   Review of Systems Review of Systems  See HPI Physical Exam Triage Vital Signs ED Triage Vitals  Enc Vitals Group     BP 03/09/23 1917 117/76     Pulse Rate 03/09/23 1917 73     Resp 03/09/23 1917 16     Temp 03/09/23 1917 98.9 F (37.2 C)     Temp Source 03/09/23 1917 Oral     SpO2 03/09/23 1917 98 %     Weight 03/09/23 1919 190 lb (86.2 kg)     Height 03/09/23 1919 5\' 5"  (1.651 m)     Head Circumference --      Peak Flow --      Pain Score 03/09/23 1919 8     Pain Loc --      Pain Edu? --      Excl. in [place]? --    No data found.  Updated Vital Signs BP 117/76 (BP Location: Right Arm)   Pulse 73   Temp 98.9 F (37.2 C) (Oral)   Resp 16   Ht 5\' 5"  (1.651 m)  Wt 86.2 kg   LMP 02/28/2023 (Exact Date)   SpO2 98%   Breastfeeding No   BMI 31.62 kg/m    Physical Exam Constitutional:      General: She is not in acute distress.    Appearance: She is well-developed.  HENT:     Head: Normocephalic and atraumatic.     Right Ear: Tympanic membrane, ear canal and external ear normal.     Left Ear: Tympanic membrane normal.     Ears:     Comments: With traction of pinna.  Slight swelling of the external auditory canal with erythema and maceration of the skin.  No discharge Eyes:     Conjunctiva/sclera: Conjunctivae normal.     Pupils: Pupils are equal, round, and reactive to light.  Cardiovascular:     Rate and Rhythm: Normal rate.  Pulmonary:     Effort: Pulmonary effort is normal. No respiratory distress.  Abdominal:     General: There is no distension.     Palpations: Abdomen is soft.  Musculoskeletal:        General: Normal range of motion.     Cervical back: Normal range of motion.  Lymphadenopathy:     Cervical: No cervical adenopathy.  Skin:    General: Skin is warm and dry.  Neurological:     Mental Status: She is alert.      UC Treatments / Results  Labs (all labs ordered are listed, but  only abnormal results are displayed) Labs Reviewed - No data to display  EKG   Radiology No results found.  Procedures Procedures (including critical care time)  Medications Ordered in UC Medications - No data to display  Initial Impression / Assessment and Plan / UC Course  I have reviewed the triage vital signs and the nursing notes.  Pertinent labs & imaging results that were available during my care of the patient were reviewed by me and considered in my medical decision making (see chart for details).     Final Clinical Impressions(s) / UC Diagnoses   Final diagnoses:  Acute otitis externa of left ear, unspecified type     Discharge Instructions      Use eardrops for 5 to 7 days until ear discomfort has resolved May take ibuprofen 600 mg as needed for pain    ED Prescriptions     Medication Sig Dispense Auth. Provider   neomycin-polymyxin-hydrocortisone (CORTISPORIN) 3.5-10000-1 OTIC suspension Place 3 drops into the left ear 3 (three) times daily. 10 mL Eustace Moore, MD      PDMP not reviewed this encounter.   Eustace Moore, MD 03/09/23 [phone]

## 2023-03-09 NOTE — Discharge Instructions (Signed)
Use eardrops for 5 to 7 days until ear discomfort has resolved May take ibuprofen 600 mg as needed for pain

## 2023-03-11 ENCOUNTER — Telehealth: Payer: Self-pay | Admitting: *Deleted

## 2023-03-11 NOTE — Telephone Encounter (Signed)
Received a voice message from a Byrd Hesselbach stating she is calling on behalf of [employer] re: mutual patient and need to verify dates of service re: Mirena IUD/ insertion/ removal/ complications. States they have dates of service 10/25/22 had IUD inserted. Then 12/22/22 had Nexplanon inserted. States to call re: verify complications/ timelines reference [id number].  Per chart is [id number] patient - will route to that office. Nancy Fetter

## 2023-03-17 ENCOUNTER — Ambulatory Visit
Admission: RE | Admit: 2023-03-17 | Discharge: 2023-03-17 | Disposition: A | Discharge status: Home / Self Care | Payer: Medicaid Other | Source: Ambulatory Visit | Attending: Family Medicine | Admitting: Family Medicine

## 2023-03-17 VITALS — BP 118/78 | HR 73 | Temp 99.1°F | Resp 14 | Wt 190.0 lb

## 2023-03-17 DIAGNOSIS — H9202 Otalgia, left ear: Secondary | ICD-10-CM

## 2023-03-17 DIAGNOSIS — J029 Acute pharyngitis, unspecified: Secondary | ICD-10-CM

## 2023-03-17 LAB — POCT RAPID STREP A (OFFICE): Rapid Strep A Screen: NEGATIVE

## 2023-03-17 MED ORDER — AMOXICILLIN 875 MG PO TABS: 875.0000 mg | ORAL_TABLET | Freq: Two times a day (BID) | ORAL | 0 refills | Status: DC

## 2023-03-17 MED ORDER — PREDNISONE 20 MG PO TABS: 20.0000 mg | ORAL_TABLET | Freq: Two times a day (BID) | ORAL | 0 refills | Status: DC

## 2023-03-17 NOTE — ED Triage Notes (Signed)
Continues to have left ear fullness Ear gtts from 03/09/23 helped w/ pain

## 2023-03-17 NOTE — ED Provider Notes (Signed)
[facility]    CSN: [id number] Arrival date & time: 03/17/23  1846      History   Chief Complaint Chief Complaint  Patient presents with   Ear Fullness    left    HPI Joann Reid is a 22 y.o. female.   HPI  Patient was seen by me a week ago.  She had otitis externa.  I treated her with Cortisporin otic.  She states she is here today because she still cannot hear out of that ear, feels full.  Sometimes painful.  She also has a very sore throat.  Is prone to strep throat.  Wants a strep test.  Past Medical History:  Diagnosis Date   Asthma     Patient Active Problem List   Diagnosis Date Noted   IUD (intrauterine device) in place 10/25/2022   Hypokalemia 10/24/2022   Vaginal delivery 10/23/2022   Depression affecting pregnancy 08/11/2022   Obesity affecting pregnancy 06/19/2022   Supervision of normal first pregnancy 04/20/2022   IBS (irritable bowel syndrome) 10/30/2021   Moderate persistent asthma 08/19/2020   Nonintractable episodic headache 01/26/2020    Past Surgical History:  Procedure Laterality Date   EYE SURGERY     IUD INSERTION  10/25/2022    OB History     Gravida  1   Para  1   Term  1   Preterm      AB      Living  1      SAB      IAB      Ectopic      Multiple  0   Live Births  1            Home Medications    Prior to Admission medications   Medication Sig Start Date End Date Taking? Authorizing Provider  amoxicillin (AMOXIL) 875 MG tablet Take 1 tablet (875 mg total) by mouth 2 (two) times daily. 03/17/23  Yes Eustace Moore, MD  neomycin-polymyxin-hydrocortisone (CORTISPORIN) 3.5-10000-1 OTIC suspension Place 3 drops into the left ear 3 (three) times daily. 03/09/23  Yes Eustace Moore, MD  predniSONE (DELTASONE) 20 MG tablet Take 1 tablet (20 mg total) by mouth 2 (two) times daily with a meal. 03/17/23  Yes Eustace Moore, MD  Prenatal Vit-Fe Fumarate-FA (PRENATAL PO) Take 1 tablet by  mouth daily.    [provider]    Family History Family History  Problem Relation Age of Onset   Hypertension Mother    Diabetes Maternal Grandmother     Social History Social History   Tobacco Use   Smoking status: Never   Smokeless tobacco: Never  Vaping Use   Vaping Use: Never used  Substance Use Topics   Alcohol use: Never   Drug use: Never     Allergies   Norco [hydrocodone-acetaminophen]   Review of Systems Review of Systems  See HPI Physical Exam Triage Vital Signs ED Triage Vitals  Enc Vitals Group     BP 03/17/23 1854 118/78     Pulse Rate 03/17/23 1854 73     Resp 03/17/23 1854 14     Temp 03/17/23 1854 99.1 F (37.3 C)     Temp Source 03/17/23 1854 Oral     SpO2 03/17/23 1854 99 %     Weight 03/17/23 1855 189 lb 15.9 oz (86.2 kg)     Height --      Head Circumference --  Peak Flow --      Pain Score 03/17/23 1855 0     Pain Loc --      Pain Edu? --      Excl. in [place]? --    No data found.  Updated Vital Signs BP 118/78 (BP Location: Right Arm)   Pulse 73   Temp 99.1 F (37.3 C) (Oral)   Resp 14   Wt 86.2 kg   LMP 02/28/2023 (Exact Date) Comment: IUD placed 03/03/23  SpO2 99%   Breastfeeding No   BMI 31.62 kg/m      Physical Exam Constitutional:      General: She is not in acute distress.    Appearance: She is well-developed. She is obese.  HENT:     Head: Normocephalic and atraumatic.     Right Ear: Ear canal and external ear normal.     Left Ear: Tympanic membrane, ear canal and external ear normal.     Ears:     Comments: Right TM is dull, yellowish color.  No erythema or bulging.  The canal is clear.    Mouth/Throat:     Mouth: Mucous membranes are moist.     Pharynx: Posterior oropharyngeal erythema present.  Eyes:     Conjunctiva/sclera: Conjunctivae normal.     Pupils: Pupils are equal, round, and reactive to light.  Cardiovascular:     Rate and Rhythm: Normal rate and regular rhythm.  Pulmonary:      Effort: Pulmonary effort is normal. No respiratory distress.     Breath sounds: Normal breath sounds.  Abdominal:     General: There is no distension.     Palpations: Abdomen is soft.  Musculoskeletal:        General: Normal range of motion.     Cervical back: Normal range of motion.  Lymphadenopathy:     Cervical: Cervical adenopathy present.  Skin:    General: Skin is warm and dry.  Neurological:     Mental Status: She is alert.      UC Treatments / Results  Labs (all labs ordered are listed, but only abnormal results are displayed) Labs Reviewed  CULTURE, GROUP A STREP [facility])  POCT RAPID STREP A (OFFICE)    EKG   Radiology No results found.  Procedures Procedures (including critical care time)  Medications Ordered in UC Medications - No data to display  Initial Impression / Assessment and Plan / UC Course  I have reviewed the triage vital signs and the nursing notes.  Pertinent labs & imaging results that were available during my care of the patient were reviewed by me and considered in my medical decision making (see chart for details).     Final Clinical Impressions(s) / UC Diagnoses   Final diagnoses:  Otalgia, left  Acute viral pharyngitis     Discharge Instructions      Make sure that you are drinking lots of water Take the amoxicillin 2 times a day for 7 days.  This is for infection in your ear and ear canal.  It should also help with your throat Take prednisone 2 times a day.  This will help to take down any swelling around your ear so that the fluid can drain out. Take Tylenol or ibuprofen for any ear pain Call or return if not improving by next week     ED Prescriptions     Medication Sig Dispense Auth. Provider   amoxicillin (AMOXIL) 875 MG tablet Take 1 tablet (875 mg  total) by mouth 2 (two) times daily. 14 tablet Eustace Moore, MD   predniSONE (DELTASONE) 20 MG tablet Take 1 tablet (20 mg total) by mouth 2 (two) times daily with  a meal. 10 tablet Eustace Moore, MD      PDMP not reviewed this encounter.   Eustace Moore, MD 03/17/23 [phone]

## 2023-03-17 NOTE — Discharge Instructions (Addendum)
Make sure that you are drinking lots of water Take the amoxicillin 2 times a day for 7 days.  This is for infection in your ear and ear canal.  It should also help with your throat Take prednisone 2 times a day.  This will help to take down any swelling around your ear so that the fluid can drain out. Take Tylenol or ibuprofen for any ear pain Call or return if not improving by next week

## 2023-03-20 LAB — CULTURE, GROUP A STREP (THRC)

## 2023-05-07 IMAGING — US US RENAL
1 series · 15 of 25 positions shown · non-contrast
Comparison: None Available.

CLINICAL DATA: Left flank pain.

EXAM:
RENAL / URINARY TRACT ULTRASOUND COMPLETE

[Series 1: us renal · 15 of 35 slices shown]
[im 1/35]
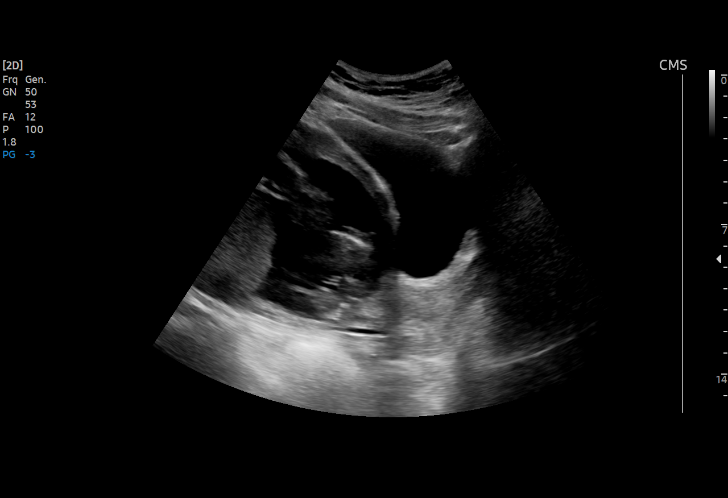
[im 3/35]
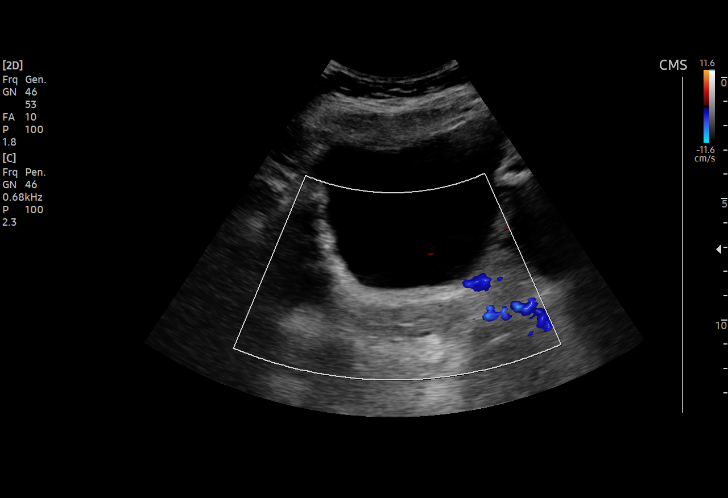
[im 6/35]
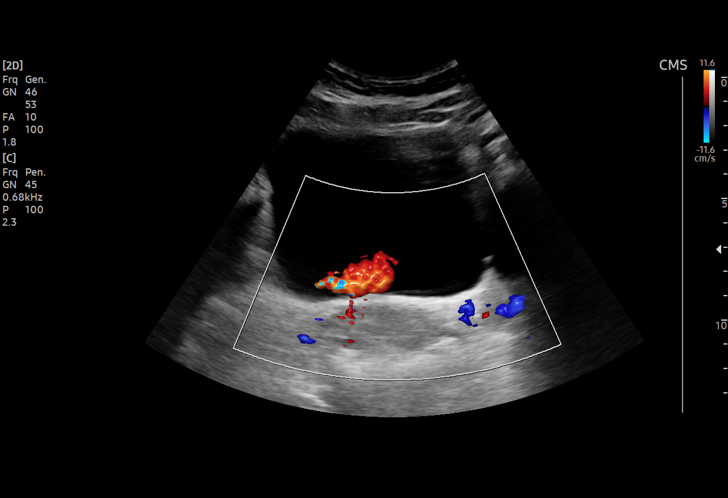
[im 8/35]
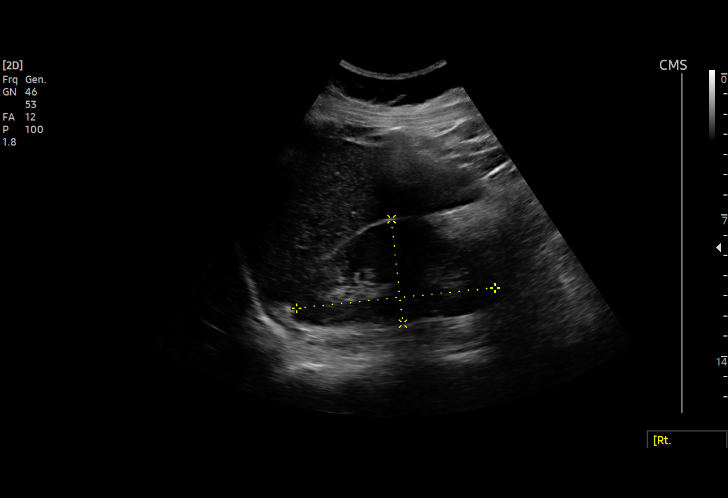
[im 10/35]
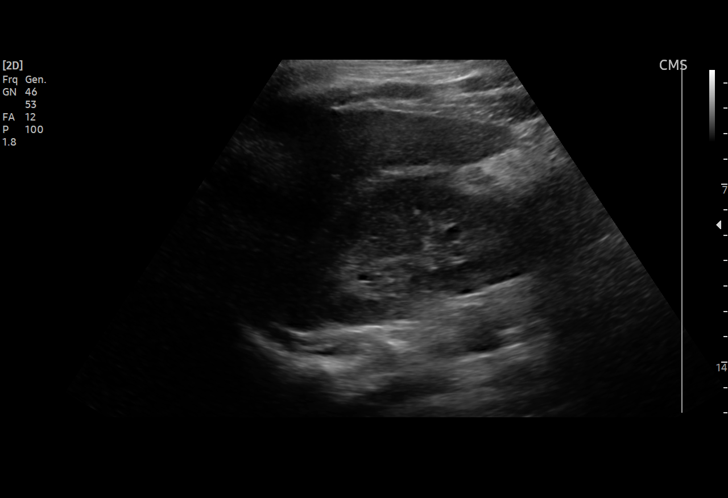
[im 13/35]
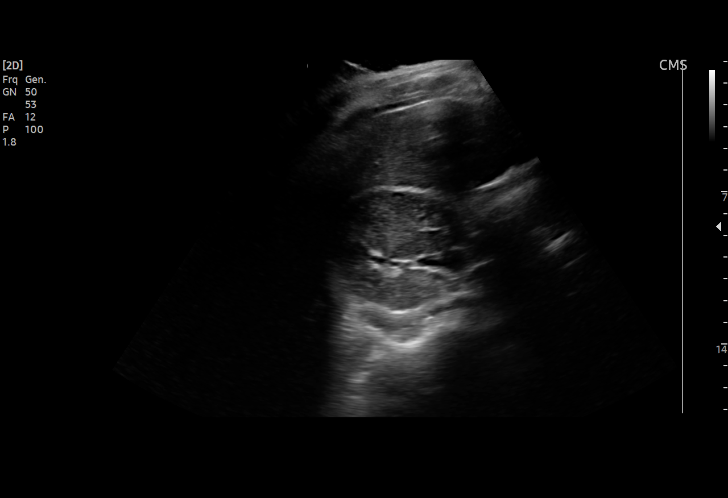
[im 15/35]
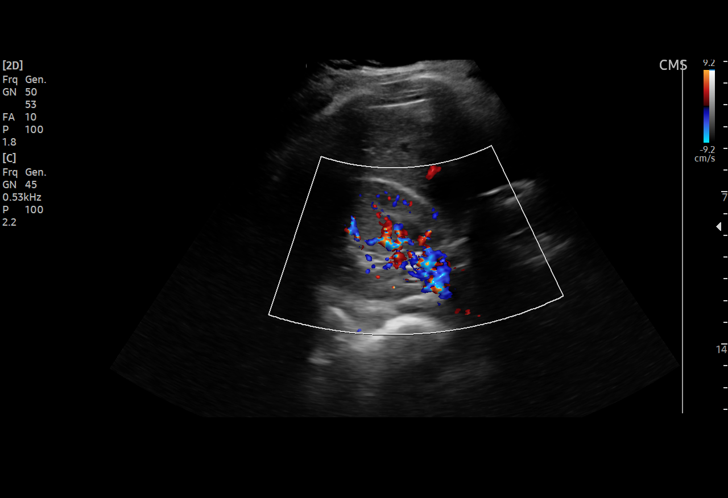
[im 18/35]
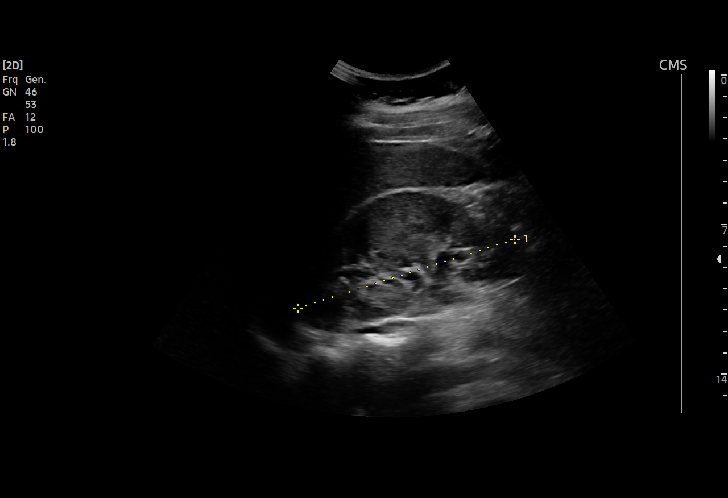
[im 20/35]
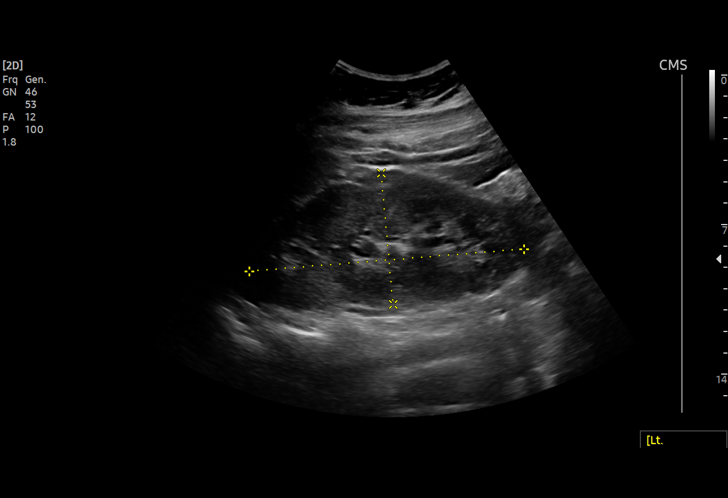
[im 22/35]
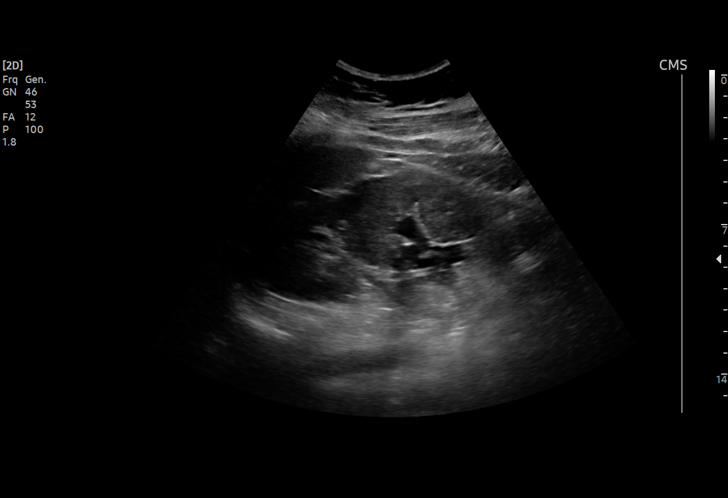
[im 25/35]
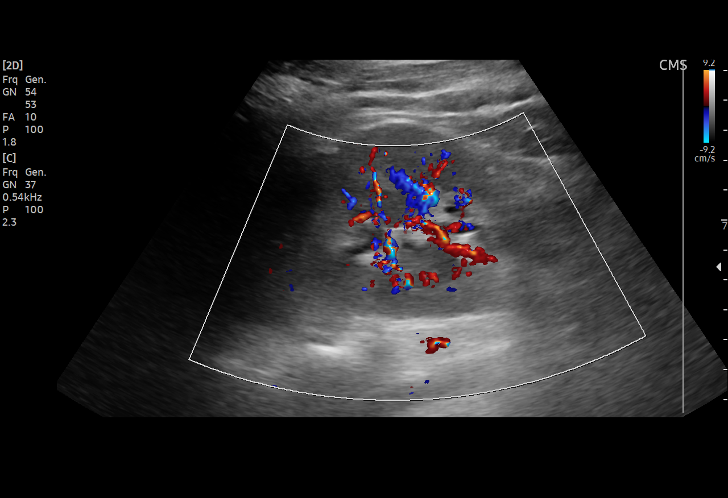
[im 27/35]
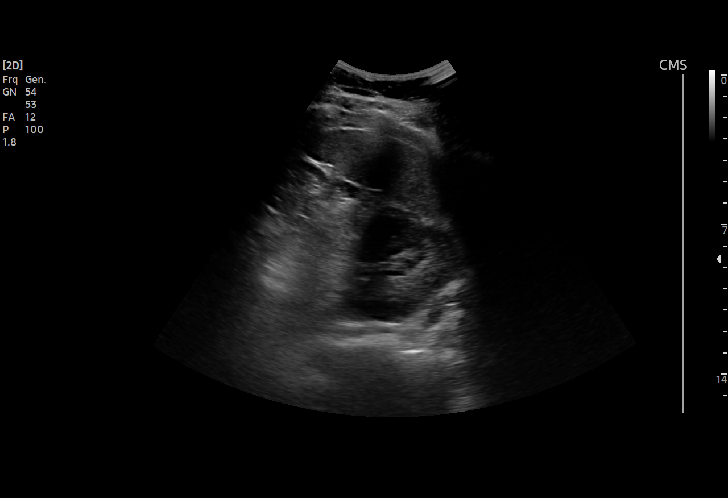
[im 29/35]
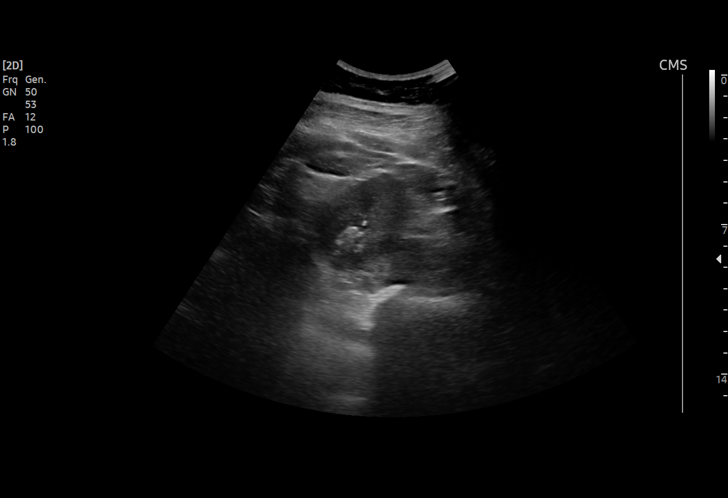
[im 32/35]
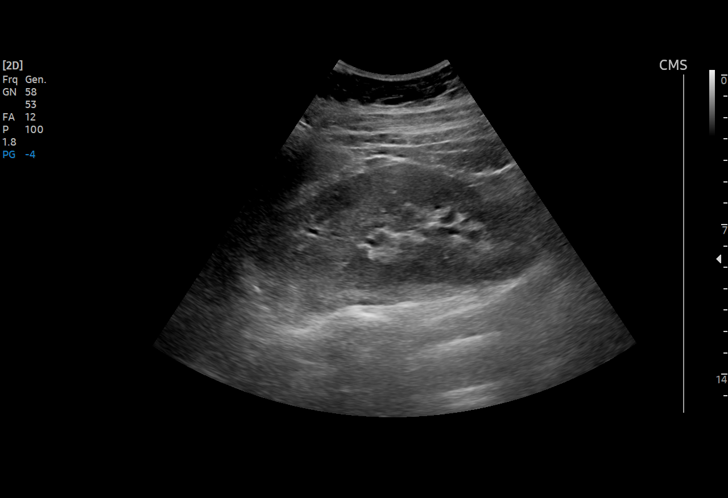
[im 35/35]
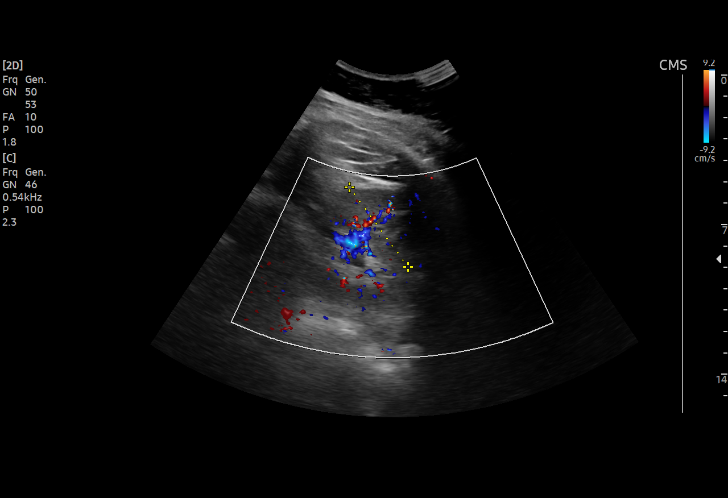

[15 of 25 positions shown; findings below may reference images not displayed]

FINDINGS: Right Kidney:

Renal measurements: 10.6 x 5.2 x 5.4 cm = volume: 157 mL.
Echogenicity within normal limits. No mass or hydronephrosis
visualized.

Left Kidney:

Renal measurements: 13.1 x 6.2 x 4.6 cm = volume: 195 mL.
Echogenicity within normal limits. No mass or hydronephrosis
visualized.

Bladder:

Appears normal for degree of bladder distention.

Other:

None.
IMPRESSION: 1. Normal renal ultrasound.

## 2023-05-17 ENCOUNTER — Ambulatory Visit
Admission: EM | Admit: 2023-05-17 | Discharge: 2023-05-17 | Disposition: A | Discharge status: Home / Self Care | Payer: Medicaid Other | Attending: Urgent Care | Admitting: Urgent Care

## 2023-05-17 DIAGNOSIS — G44201 Tension-type headache, unspecified, intractable: Secondary | ICD-10-CM

## 2023-05-17 MED ORDER — DEXAMETHASONE SODIUM PHOSPHATE 10 MG/ML IJ SOLN
10.0000 mg | Freq: Once | INTRAMUSCULAR | Status: AC
  Administered 2023-05-17: 10 mg via INTRAMUSCULAR

## 2023-05-17 MED ORDER — BUTALBITAL-APAP-CAFFEINE 50-325-40 MG PO TABS: 1.0000 | ORAL_TABLET | Freq: Four times a day (QID) | ORAL | 0 refills | Status: DC | PRN

## 2023-05-17 MED ORDER — KETOROLAC TROMETHAMINE 30 MG/ML IJ SOLN
30.0000 mg | Freq: Once | INTRAMUSCULAR | Status: AC
  Administered 2023-05-17: 30 mg via INTRAMUSCULAR

## 2023-05-17 MED ORDER — METOCLOPRAMIDE HCL 5 MG/ML IJ SOLN
5.0000 mg | Freq: Once | INTRAMUSCULAR | Status: AC
  Administered 2023-05-17: 5 mg via INTRAMUSCULAR

## 2023-05-17 MED ORDER — BACLOFEN 10 MG PO TABS: 10.0000 mg | ORAL_TABLET | Freq: Three times a day (TID) | ORAL | 0 refills | Status: DC

## 2023-05-17 MED ORDER — ONDANSETRON 4 MG PO TBDP
4.0000 mg | ORAL_TABLET | Freq: Once | ORAL | Status: AC
  Administered 2023-05-17: 4 mg via ORAL

## 2023-05-17 NOTE — ED Provider Notes (Signed)
[facility]    CSN: [id number] Arrival date & time: 05/17/23  0944      History   Chief Complaint Chief Complaint  Patient presents with   Migraine    My head been hurting for 2 days. I can't look at light. I been throwing up this morning. Noise hurts my ears. - Entered by patient    HPI Kenleigh Loring Freytes is a 22 y.o. female.   Pleasant 22 year old female presents today due to concerns of a migraine.  She states it started on Saturday.  She denies a prior history of migraines and reports this is different than normal.  She denies a thunderclap headache or the worst headache of her life.  She denies any trauma or injury to her head.  She states it currently is affecting behind both of her eyes and her bilateral occipital area.  She reports photophobia and nausea.  She denies potential for pregnancy, states she is currently on her menstrual period.  She denies nuchal rigidity, but states that flexion of her neck makes her dizzy.  She denies a fever.  She took Benadryl this morning which did not help, she took ibuprofen a few days prior without relief.  She denies weakness, slurred speech, tinnitus, or gait instability.  She denies any history of neurological issues.  She is not on any new prescription medications. She admits to being under increased stress.   Migraine    Past Medical History:  Diagnosis Date   Asthma     Patient Active Problem List   Diagnosis Date Noted   IUD (intrauterine device) in place 10/25/2022   Hypokalemia 10/24/2022   Vaginal delivery 10/23/2022   Depression affecting pregnancy 08/11/2022   Obesity affecting pregnancy 06/19/2022   Supervision of normal first pregnancy 04/20/2022   IBS (irritable bowel syndrome) 10/30/2021   Moderate persistent asthma 08/19/2020   Nonintractable episodic headache 01/26/2020    Past Surgical History:  Procedure Laterality Date   EYE SURGERY     IUD INSERTION  10/25/2022    OB History      Gravida  1   Para  1   Term  1   Preterm      AB      Living  1      SAB      IAB      Ectopic      Multiple  0   Live Births  1            Home Medications    Prior to Admission medications   Medication Sig Start Date End Date Taking? Authorizing Provider  baclofen (LIORESAL) 10 MG tablet Take 1 tablet (10 mg total) by mouth 3 (three) times daily. 05/17/23  Yes Orlander Norwood L, PA  butalbital-acetaminophen-caffeine (FIORICET) 50-325-40 MG tablet Take 1 tablet by mouth every 6 (six) hours as needed for headache. 05/17/23  Yes Eoin Willden L, PA  neomycin-polymyxin-hydrocortisone (CORTISPORIN) 3.5-10000-1 OTIC suspension Place 3 drops into the left ear 3 (three) times daily. 03/09/23   Eustace Moore, MD  Prenatal Vit-Fe Fumarate-FA (PRENATAL PO) Take 1 tablet by mouth daily.    [provider]    Family History Family History  Problem Relation Age of Onset   Hypertension Mother    Diabetes Maternal Grandmother     Social History Social History   Tobacco Use   Smoking status: Never   Smokeless tobacco: Never  Vaping Use   Vaping Use: Never used  Substance Use Topics   Alcohol use: Never   Drug use: Never     Allergies   Norco [hydrocodone-acetaminophen]   Review of Systems Review of Systems As per HPI  Physical Exam Triage Vital Signs ED Triage Vitals  Enc Vitals Group     BP 05/17/23 1015 126/75     Pulse Rate 05/17/23 1015 86     Resp 05/17/23 1015 16     Temp 05/17/23 1015 98.3 F (36.8 C)     Temp src --      SpO2 05/17/23 1015 98 %     Weight --      Height --      Head Circumference --      Peak Flow --      Pain Score 05/17/23 1014 10     Pain Loc --      Pain Edu? --      Excl. in GC? --    No data found.  Updated Vital Signs BP 126/75   Pulse 86   Temp 98.3 F (36.8 C)   Resp 16   LMP 05/12/2023 (Approximate)   SpO2 98%   Visual Acuity Right Eye Distance:   Left Eye Distance:   Bilateral  Distance:    Right Eye Near:   Left Eye Near:    Bilateral Near:     Physical Exam Vitals and nursing note reviewed.  Constitutional:      General: She is not in acute distress.    Appearance: Normal appearance. She is normal weight. She is not ill-appearing, toxic-appearing or diaphoretic.     Comments: Pt laying down on table in dark room with sunglasses on, wrapped in blanket  HENT:     Head: Normocephalic and atraumatic.     Right Ear: External ear normal.     Left Ear: External ear normal.     Mouth/Throat:     Mouth: Mucous membranes are moist.     Pharynx: Oropharynx is clear. No oropharyngeal exudate or posterior oropharyngeal erythema.  Eyes:     General: No scleral icterus.       Right eye: No discharge.        Left eye: No discharge.     Extraocular Movements: Extraocular movements intact.     Conjunctiva/sclera: Conjunctivae normal.     Pupils: Pupils are equal, round, and reactive to light.  Neck:     Meningeal: Brudzinski's sign and Kernig's sign absent.   Cardiovascular:     Rate and Rhythm: Normal rate and regular rhythm.  Pulmonary:     Effort: Pulmonary effort is normal. No respiratory distress.     Breath sounds: Normal breath sounds. No stridor. No wheezing, rhonchi or rales.  Chest:     Chest wall: No tenderness.  Musculoskeletal:        General: No swelling. Normal range of motion.     Cervical back: Normal range of motion and neck supple. Tenderness (reproducible tenderness to B scalenes, cervical paraspinals, and traps) present. No rigidity. Muscular tenderness present. No spinous process tenderness. Normal range of motion.     Right lower leg: No edema.     Left lower leg: No edema.  Lymphadenopathy:     Cervical: No cervical adenopathy.     Right cervical: No superficial or deep cervical adenopathy.    Left cervical: No superficial or deep cervical adenopathy.  Skin:    General: Skin is warm and dry.     Coloration: Skin is not  jaundiced.      Findings: No bruising, erythema or rash.  Neurological:     General: No focal deficit present.     Mental Status: She is alert and oriented to person, place, and time.     Cranial Nerves: No cranial nerve deficit.     Sensory: No sensory deficit.     Motor: No weakness.     Coordination: Coordination normal.     Gait: Gait normal.  Psychiatric:        Mood and Affect: Mood normal.        Behavior: Behavior normal.      UC Treatments / Results  Labs (all labs ordered are listed, but only abnormal results are displayed) Labs Reviewed - No data to display  EKG   Radiology No results found.  Procedures Procedures (including critical care time)  Medications Ordered in UC Medications  ondansetron (ZOFRAN-ODT) disintegrating tablet 4 mg (4 mg Oral Given 05/17/23 1019)  ketorolac (TORADOL) 30 MG/ML injection 30 mg (30 mg Intramuscular Given 05/17/23 1050)  metoCLOPramide (REGLAN) injection 5 mg (5 mg Intramuscular Given 05/17/23 1050)  dexamethasone (DECADRON) injection 10 mg (10 mg Intramuscular Given 05/17/23 1050)    Initial Impression / Assessment and Plan / UC Course  I have reviewed the triage vital signs and the nursing notes.  Pertinent labs & imaging results that were available during my care of the patient were reviewed by me and considered in my medical decision making (see chart for details).     Acute intractable tension type headache - pt was monitored in our urgent care for nearly two hours. Upon initial evaluation, pt was in dark quiet room. She was administered the migraine cocktail. Provider checked on patient roughly 30 min after administration with minimal relief stated. Pt was however noted to be on her cell phone numerous times with no issue from the lights. Given lack of initial response and significant tenderness to B traps/ neck, heat pack was applied. Pt was monitored for an additional 24 min with significant relief after this additional therapy. Pt was up  and moving around, again on phone upon recheck. There was no nuchal rigidity and neuro exam was normal in office. Pt admitted to increase stress at present time. Suspect tension headache will therefore DC home with butalbital for severe, refractory pain. Recommended moist heat, massage and baclofen for less intense pain. If any persistent or new symptoms occur, please head to ER.   Final Clinical Impressions(s) / UC Diagnoses   Final diagnoses:  Acute intractable tension-type headache     Discharge Instructions      Your symptoms are most consistent with a tension headache.  Please read the attached handouts for instructions on this. I have prescribed you a muscle relaxer.  You may take this up to 3 times daily, however use with caution as it can make you tired or drowsy. Please purchase a microwavable heat pack and apply to the back of your neck.  This will help with the tension you are experiencing. Please only take the butalbital on an as-needed basis for severe symptoms.  Do not exceed 4 tablets per week to prevent rebound headaches. If your symptoms persist >24 hours, please return for recheck or head to the ER.      ED Prescriptions     Medication Sig Dispense Auth. Provider   butalbital-acetaminophen-caffeine (FIORICET) 50-325-40 MG tablet Take 1 tablet by mouth every 6 (six) hours as needed for headache. 20 tablet Crowder, [place]  L, PA   baclofen (LIORESAL) 10 MG tablet Take 1 tablet (10 mg total) by mouth 3 (three) times daily. 30 each Jeni Duling L, PA      PDMP not reviewed this encounter.   Maretta Bees, [place] 05/17/23 1944

## 2023-05-17 NOTE — ED Notes (Signed)
Warm packs given to patient to apply to back of head/ neck for pain management per whitney PA

## 2023-05-17 NOTE — ED Triage Notes (Signed)
Pt presents to uc with co of migraine for 2 days with increase in pain this morning. Pt reports sh ehas been taking 800 mg motrin and benadryl with no improvement. She endorses nausea, and light sensitivity.

## 2023-05-17 NOTE — Discharge Instructions (Addendum)
Your symptoms are most consistent with a tension headache.  Please read the attached handouts for instructions on this. I have prescribed you a muscle relaxer.  You may take this up to 3 times daily, however use with caution as it can make you tired or drowsy. Please purchase a microwavable heat pack and apply to the back of your neck.  This will help with the tension you are experiencing. Please only take the butalbital on an as-needed basis for severe symptoms.  Do not exceed 4 tablets per week to prevent rebound headaches. If your symptoms persist >24 hours, please return for recheck or head to the ER.

## 2023-05-17 NOTE — ED Notes (Signed)
Pt given blanket, pillow, night light and lights tuned out with nausea meds for comfort.

## 2023-07-08 ENCOUNTER — Ambulatory Visit: Payer: Medicaid Other

## 2023-08-10 ENCOUNTER — Ambulatory Visit: Payer: Medicaid Other | Admitting: Obstetrics and Gynecology

## 2023-08-12 ENCOUNTER — Ambulatory Visit: Payer: Medicaid Other | Admitting: Obstetrics and Gynecology

## 2023-09-07 ENCOUNTER — Other Ambulatory Visit (HOSPITAL_COMMUNITY)
Admission: RE | Admit: 2023-09-07 | Discharge: 2023-09-07 | Disposition: A | Discharge status: Home / Self Care | Payer: Medicaid Other | Source: Ambulatory Visit | Attending: Obstetrics and Gynecology | Admitting: Obstetrics and Gynecology

## 2023-09-07 ENCOUNTER — Encounter: Payer: Self-pay | Admitting: Obstetrics and Gynecology

## 2023-09-07 ENCOUNTER — Ambulatory Visit: Payer: Medicaid Other | Admitting: Obstetrics and Gynecology

## 2023-09-07 VITALS — BP 121/80 | HR 65 | Ht 65.0 in | Wt 204.0 lb

## 2023-09-07 DIAGNOSIS — Z30432 Encounter for removal of intrauterine contraceptive device: Secondary | ICD-10-CM | POA: Diagnosis not present

## 2023-09-07 DIAGNOSIS — Z113 Encounter for screening for infections with a predominantly sexual mode of transmission: Secondary | ICD-10-CM

## 2023-09-07 NOTE — Progress Notes (Addendum)
IUD Removal   Patient was in the dorsal lithotomy position, normal external genitalia was noted.  A speculum was placed in the patient's vagina, normal discharge was noted, no lesions. The multiparous cervix was visualized, no lesions, no abnormal discharge.  The strings of the IUD were grasped and pulled using ring forceps. The IUD was removed in its entirety. Patient tolerated the procedure well.     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    Venia Carbon I, NP 09/07/2023 9:35 AM

## 2023-09-07 NOTE — Addendum Note (Signed)
Addended by: Venia Carbon I on: 09/07/2023 09:35 AM   Modules accepted: Level of Service

## 2023-09-08 LAB — RPR: RPR Ser Ql: NONREACTIVE

## 2023-09-08 LAB — CERVICOVAGINAL ANCILLARY ONLY
Chlamydia: NEGATIVE
Comment: NEGATIVE
Comment: NEGATIVE
Comment: NORMAL
Neisseria Gonorrhea: NEGATIVE
Trichomonas: NEGATIVE

## 2023-09-08 LAB — HEPATITIS C ANTIBODY: Hep C Virus Ab: NONREACTIVE

## 2023-09-08 LAB — HIV ANTIBODY (ROUTINE TESTING W REFLEX): HIV Screen 4th Generation wRfx: NONREACTIVE

## 2023-09-08 LAB — HEPATITIS B SURFACE ANTIGEN: Hepatitis B Surface Ag: NEGATIVE

## 2023-11-04 ENCOUNTER — Telehealth: Payer: Self-pay

## 2023-11-04 ENCOUNTER — Ambulatory Visit
Admission: RE | Admit: 2023-11-04 | Discharge: 2023-11-04 | Disposition: A | Discharge status: Home / Self Care | Payer: Medicaid Other | Source: Ambulatory Visit | Attending: Family Medicine | Admitting: Family Medicine

## 2023-11-04 VITALS — BP 115/79 | HR 71 | Temp 98.4°F | Resp 16

## 2023-11-04 DIAGNOSIS — N898 Other specified noninflammatory disorders of vagina: Secondary | ICD-10-CM | POA: Diagnosis present

## 2023-11-04 LAB — POCT URINALYSIS DIP (MANUAL ENTRY)
Bilirubin, UA: NEGATIVE
Glucose, UA: NEGATIVE mg/dL
Ketones, POC UA: NEGATIVE mg/dL
Nitrite, UA: NEGATIVE
Protein Ur, POC: NEGATIVE mg/dL
Spec Grav, UA: 1.02 (ref 1.010–1.025)
Urobilinogen, UA: 0.2 U/dL
pH, UA: 7 (ref 5.0–8.0)

## 2023-11-04 NOTE — ED Triage Notes (Addendum)
Pt presents to uc with co of vaginal itching and discharge since 3 days ago. And new onset of dysuria  [facility] yesterday and was given monistat which is not helping.

## 2023-11-04 NOTE — ED Provider Notes (Signed)
[facility]    CSN: [id number] Arrival date & time: 11/04/23  1817      History   Chief Complaint Chief Complaint  Patient presents with   Dysuria    HPI Joann Reid is a 22 y.o. female.   Patient complains of pruritic vaginal discharge for 3 days, and has now developed mild dysuria without urgency or frequency.  She denies abdominal/pelvic pain, nausea/vomiting, and fevers, chills, and sweats. She had a telemedicine consult with [facility] yesterday and prescribed Monistat which has not been helpful.  No LMP recorded. (Menstrual status: IUD).     The history is provided by the patient.  Vaginal Discharge Quality:  Milky Severity:  Mild Onset quality:  Sudden Duration:  3 days Timing:  Constant Progression:  Unchanged Chronicity:  New Relieved by:  Nothing Worsened by:  Nothing Ineffective treatments: Monistat. Associated symptoms: dysuria and vaginal itching   Associated symptoms: no abdominal pain, no fever, no genital lesions, no nausea, no rash, no urinary frequency, no urinary hesitancy and no urinary incontinence     Past Medical History:  Diagnosis Date   Asthma     Patient Active Problem List   Diagnosis Date Noted   IUD (intrauterine device) in place 10/25/2022   Hypokalemia 10/24/2022   Vaginal delivery 10/23/2022   Depression affecting pregnancy 08/11/2022   Obesity affecting pregnancy 06/19/2022   Supervision of normal first pregnancy 04/20/2022   IBS (irritable bowel syndrome) 10/30/2021   Moderate persistent asthma 08/19/2020   Nonintractable episodic headache 01/26/2020    Past Surgical History:  Procedure Laterality Date   EYE SURGERY     IUD INSERTION  10/25/2022    OB History     Gravida  1   Para  1   Term  1   Preterm      AB      Living  1      SAB      IAB      Ectopic      Multiple  0   Live Births  1            Home Medications    Prior to Admission medications   Not on  File    Family History Family History  Problem Relation Age of Onset   Hypertension Mother    Diabetes Maternal Grandmother     Social History Social History   Tobacco Use   Smoking status: Never   Smokeless tobacco: Never  Vaping Use   Vaping status: Never Used  Substance Use Topics   Alcohol use: Never   Drug use: Never     Allergies   Hydrocodone-acetaminophen   Review of Systems Review of Systems  Constitutional:  Negative for appetite change, chills, diaphoresis, fatigue and fever.  Gastrointestinal:  Negative for abdominal pain and nausea.  Genitourinary:  Positive for dysuria and vaginal discharge. Negative for bladder incontinence, flank pain, frequency, genital sores, hematuria, hesitancy, pelvic pain and urgency.  All other systems reviewed and are negative.    Physical Exam Triage Vital Signs ED Triage Vitals  Encounter Vitals Group     BP 11/04/23 1837 115/79     Systolic BP Percentile --      Diastolic BP Percentile --      Pulse Rate 11/04/23 1837 71     Resp 11/04/23 1837 16     Temp 11/04/23 1837 98.4 F (36.9 C)     Temp src --  SpO2 11/04/23 1837 98 %     Weight --      Height --      Head Circumference --      Peak Flow --      Pain Score 11/04/23 1836 8     Pain Loc --      Pain Education --      Exclude from Growth Chart --    No data found.  Updated Vital Signs BP 115/79   Pulse 71   Temp 98.4 F (36.9 C)   Resp 16   SpO2 98%   Visual Acuity Right Eye Distance:   Left Eye Distance:   Bilateral Distance:    Right Eye Near:   Left Eye Near:    Bilateral Near:     Physical Exam Nursing notes and Vital Signs reviewed. Appearance:  Patient appears stated age, and in no acute distress.    Eyes:  Pupils are equal, round, and reactive to light and accomodation.  Extraocular movement is intact.  Conjunctivae are not inflamed   Pharynx:  Normal; moist mucous membranes  Neck:  Supple.  No adenopathy Lungs:  Clear to  auscultation.  Breath sounds are equal.  Moving air well. Heart:  Regular rate and rhythm without murmurs, rubs, or gallops.  Abdomen:  Nontender without masses or hepatosplenomegaly.  Bowel sounds are present.  No CVA or flank tenderness.  Extremities:  No edema.  Skin:  No rash present.     UC Treatments / Results  Labs (all labs ordered are listed, but only abnormal results are displayed) Labs Reviewed  POCT URINALYSIS DIP (MANUAL ENTRY) - Abnormal; Notable for the following components:      Result Value   Clarity, UA cloudy (*)    Blood, UA large (*)    Leukocytes, UA Small (1+) (*)    All other components within normal limits  URINE CULTURE  CERVICOVAGINAL ANCILLARY ONLY    EKG   Radiology No results found.  Procedures Procedures (including critical care time)  Medications Ordered in UC Medications - No data to display  Initial Impression / Assessment and Plan / UC Course  I have reviewed the triage vital signs and the nursing notes.  Pertinent labs & imaging results that were available during my care of the patient were reviewed by me and considered in my medical decision making (see chart for details).    Cervicovaginal ancillary and urine culture pending.  Final Clinical Impressions(s) / UC Diagnoses   Final diagnoses:  Vaginal discharge     Discharge Instructions      Await results of testing to determine treatment.    ED Prescriptions   None       Lattie Haw, MD 11/05/23 1651

## 2023-11-04 NOTE — Discharge Instructions (Signed)
Await results of testing to determine treatment.

## 2023-11-06 ENCOUNTER — Telehealth: Payer: Self-pay

## 2023-11-06 LAB — URINE CULTURE
Culture: 30000 — AB
Special Requests: NORMAL

## 2023-11-06 MED ORDER — AMOXICILLIN 500 MG PO CAPS: 500.0000 mg | ORAL_CAPSULE | Freq: Two times a day (BID) | ORAL | 0 refills | Status: DC

## 2023-11-06 NOTE — Telephone Encounter (Signed)
Patient called per Dr. Delton See and notified she has a positive urinary culture and needs abx, which will be put in by Dr. Delton See

## 2023-11-06 NOTE — Telephone Encounter (Signed)
Patient called for her urine results.  Preliminary report is group B strep.  I am sending in amoxicillin.  Patient notified by telephone.  Vaginal swab pending

## 2023-11-08 ENCOUNTER — Telehealth (HOSPITAL_COMMUNITY): Payer: Self-pay

## 2023-11-08 LAB — CERVICOVAGINAL ANCILLARY ONLY
Bacterial Vaginitis (gardnerella): POSITIVE — AB
Candida Glabrata: NEGATIVE
Candida Vaginitis: NEGATIVE
Chlamydia: NEGATIVE
Comment: NEGATIVE
Comment: NEGATIVE
Comment: NEGATIVE
Comment: NEGATIVE
Comment: NEGATIVE
Comment: NORMAL
Neisseria Gonorrhea: NEGATIVE
Trichomonas: NEGATIVE

## 2023-11-08 MED ORDER — METRONIDAZOLE 500 MG PO TABS: 500.0000 mg | ORAL_TABLET | Freq: Two times a day (BID) | ORAL | 0 refills | Status: AC

## 2023-11-08 NOTE — Telephone Encounter (Signed)
Per protocol, pt requires tx with metronidazole. Rx sent to pharmacy on file.

## 2023-12-14 ENCOUNTER — Encounter: Payer: Self-pay | Admitting: Obstetrics and Gynecology

## 2023-12-14 ENCOUNTER — Other Ambulatory Visit (HOSPITAL_COMMUNITY)
Admission: RE | Admit: 2023-12-14 | Discharge: 2023-12-14 | Disposition: A | Discharge status: Home / Self Care | Payer: Medicaid Other | Source: Ambulatory Visit | Attending: Obstetrics and Gynecology | Admitting: Obstetrics and Gynecology

## 2023-12-14 ENCOUNTER — Ambulatory Visit: Payer: Medicaid Other | Admitting: Obstetrics and Gynecology

## 2023-12-14 VITALS — Temp 95.4°F | Ht 65.0 in | Wt 205.0 lb

## 2023-12-14 DIAGNOSIS — N926 Irregular menstruation, unspecified: Secondary | ICD-10-CM | POA: Diagnosis not present

## 2023-12-14 DIAGNOSIS — R102 Pelvic and perineal pain: Secondary | ICD-10-CM | POA: Diagnosis not present

## 2023-12-14 DIAGNOSIS — N898 Other specified noninflammatory disorders of vagina: Secondary | ICD-10-CM | POA: Diagnosis present

## 2023-12-14 LAB — POCT URINE PREGNANCY: Preg Test, Ur: NEGATIVE

## 2023-12-14 MED ORDER — FLUCONAZOLE 150 MG PO TABS: 150.0000 mg | ORAL_TABLET | Freq: Every day | ORAL | 0 refills | Status: DC

## 2023-12-14 NOTE — Progress Notes (Signed)
Pt declined STI testing 

## 2023-12-14 NOTE — Progress Notes (Signed)
 GYNECOLOGY ENCOUNTER NOTE  History:     Joann Reid is a 22 y.o. [id number] female here for a routine annual gynecologic exam.  Current complaints: abnormal bleeding.   Denies pelvic pain, problems with intercourse or other gynecologic concerns.   O positive blood type.   Dec 31; she reports a  faint positive home pregnancy test  Abnormal spotting started Jan 1, passed clot, Started having vaginal itching on 1/4. Jan 3: passed large blood clot, picture reviewed, no tissue noted.   No vaginal bleeding today.   Period would normally come in the middle of every month. She has a period every month, heavy in nature. Not currently on birth control.   Reports lower abdominal pain that comes and goes, worse on the right side. No fever, chills or N/V.   Had pelvic US  in 05/2023: Almost solid appearing lesion within the left ovary without increased vascularity. Recommend further evaluation with MRI without contrast.  Likely partially calcified fundal fibroid. CT done in 07/20/23 with no mention of the left ovary.   Gynecologic History Patient's last menstrual period was 12/10/2023. Contraception:    Obstetric History OB History  Gravida Para Term Preterm AB Living  1 1 1   1   SAB IAB Ectopic Multiple Live Births     0 1    # Outcome Date GA Lbr Len/2nd Weight Sex Type Anes PTL Lv  1 Term 10/25/22 [redacted]w[redacted]d / 00:16 7 lb 4.1 oz (3.29 kg) F Vag-Spont EPI  LIV    Past Medical History:  Diagnosis Date   Asthma     Past Surgical History:  Procedure Laterality Date   EYE SURGERY     IUD INSERTION  10/25/2022    No current outpatient medications on file prior to visit.   No current facility-administered medications on file prior to visit.    Allergies  Allergen Reactions   Hydrocodone-Acetaminophen  Nausea Only and Other (See Comments)    Social History:  reports that she has never smoked. She has never used smokeless tobacco. She reports that she does not drink alcohol  and does not use drugs.  Family History  Problem Relation Age of Onset   Hypertension Mother    Diabetes Maternal Grandmother     The following portions of the patient's history were reviewed and updated as appropriate: allergies, current medications, past family history, past medical history, past social history, past surgical history and problem list.  Review of Systems Pertinent items noted in HPI and remainder of comprehensive ROS otherwise negative.  Physical Exam:  Ht 5' 5 (1.651 m)   Wt 205 lb (93 kg)   LMP 12/10/2023   BMI 34.11 kg/m  CONSTITUTIONAL: Well-developed, well-nourished female in no acute distress.  HENT:  Normocephalic, atraumatic, External right and left ear normal.  EYES: Conjunctivae and EOM are normal. Pupils are equal, round, and reactive to light. No scleral icterus.  NECK: Normal range of motion, supple, no masses.  Normal thyroid.  SKIN: Skin is warm and dry. No rash noted. Not diaphoretic. No erythema. No pallor. MUSCULOSKELETAL: Normal range of motion. No tenderness.  No cyanosis, clubbing, or edema. NEUROLOGIC: Alert and oriented to person, place, and time. Normal reflexes, muscle tone coordination.  PSYCHIATRIC: Normal mood and affect. Normal behavior. Normal judgment and thought content. CARDIOVASCULAR: Normal heart rate noted, regular rhythm RESPIRATORY: Clear to auscultation bilaterally. Effort and breath sounds normal, no problems with respiration noted. BREASTS: Symmetric in size. No masses, tenderness, skin changes, nipple drainage,  or lymphadenopathy bilaterally. Performed in the presence of a chaperone. ABDOMEN: Soft, no distention noted.  No tenderness, rebound or guarding.  PELVIC: Normal appearing external genitalia and urethral meatus; normal appearing vaginal mucosa and cervix.  No abnormal vaginal discharge noted.  Pap smear obtained.  Normal uterine size, no other palpable masses, no uterine or adnexal tenderness.  Performed in the  presence of a chaperone.   Assessment and Plan:  1. Vaginal discharge (Primary)  - Cervicovaginal ancillary only( [place])  2. Vaginal odor  - Cervicovaginal ancillary only( [place])  3. Vaginal itching  - Cervicovaginal ancillary only( [place]) - Large yeast noted in the vaginal vault. Rx: diflucan .   4. Irregular bleeding  - reports positive home pregnancy test.  - POCT urine pregnancy - Beta hCG quant (ref lab)  5. Pelvic pain  - US  PELVIC COMPLETE WITH TRANSVAGINAL; Future   Will follow up results, will consider MRI if abnormal pelvic US .     Niyah Mamaril, Delon FERNS, NP [facility] [employer], [facility]

## 2023-12-15 ENCOUNTER — Other Ambulatory Visit: Payer: Self-pay

## 2023-12-15 DIAGNOSIS — B9689 Other specified bacterial agents as the cause of diseases classified elsewhere: Secondary | ICD-10-CM

## 2023-12-15 LAB — CERVICOVAGINAL ANCILLARY ONLY
Bacterial Vaginitis (gardnerella): POSITIVE — AB
Candida Glabrata: NEGATIVE
Candida Vaginitis: POSITIVE — AB
Comment: NEGATIVE
Comment: NEGATIVE
Comment: NEGATIVE

## 2023-12-15 LAB — BETA HCG QUANT (REF LAB): hCG Quant: <1 m[IU]/mL

## 2023-12-15 MED ORDER — METRONIDAZOLE 500 MG PO TABS: 500.0000 mg | ORAL_TABLET | Freq: Two times a day (BID) | ORAL | 0 refills | Status: DC

## 2023-12-16 ENCOUNTER — Ambulatory Visit: Payer: Medicaid Other

## 2023-12-21 ENCOUNTER — Other Ambulatory Visit: Payer: Medicaid Other

## 2023-12-27 ENCOUNTER — Other Ambulatory Visit: Payer: Medicaid Other

## 2024-01-04 ENCOUNTER — Other Ambulatory Visit: Payer: Medicaid Other

## 2024-01-04 NOTE — Progress Notes (Signed)
   RETURN GYNECOLOGY VISIT  Subjective:  Joann Reid is a 23 y.o. G1P1001 with LMP 12/01/23 and possible pregnancy presenting for abdominal pain  1 week of stomach pain. Started upper then moved to lower abdomen. Notes some L sided back pain. Started throwing up & diarrhea yesterday. Threw up three times this morning.  Works at a daycare. No recent travel or new foods.  Had norovirus 3-4 weeks ago.  +chills, no fevers  No anormal vaginal discharge. Notes incomplete emptying of her bladder and hesitancy  Positive home UPT  Objective:   Vitals:   01/05/24 0916  BP: 120/79  Pulse: (!) 105  Temp: 98.1 F (36.7 C)  Weight: 200 lb (90.7 kg)  Height: 5' 5 (1.651 m)   General:  Alert, oriented and cooperative. Patient is in no acute distress.  Skin: Skin is warm and dry. No rash noted.   Cardiovascular: Mild tachycardia noted  Respiratory: Normal respiratory effort, no problems with respiration noted  Abdomen: Mild epigastric and suprapubic tenderness. No rebound or guarding. No CVA tenderness   Assessment and Plan:  Joann Reid is a 24 y.o. at [redacted]w[redacted]d by LMP with urinary tract infection, possible nausea/vomiting of early pregnancy vs possible pyelonephritis  Complicated urinary tract infection Urine dip +leuks, nitrate & ketones c/f both UTI and dehydration Afebrile, well appearing Abx sent for possible early pyelo given her back pain Reviewed pt should feel better within 24-48h and should call if she does not improve. Reviewed MAU for inability to tolerate PO, CP/SOB, or significant worsening in her symptoms -     POCT Urinalysis Dipstick -     Culture, OB Urine -     amoxicillin -clavulanate (AUGMENTIN ) 875-125 MG tablet; Take 1 tablet by mouth 2 (two) times daily for 10 days.  Less than [redacted] weeks gestation of pregnancy Nausea and vomiting, unspecified vomiting type Unplanned/unexpected pregnancy. Discussed options. Pt would like to start prenatal care. -      POCT urine pregnancy -     CBC -     Comp Met (CMET) -     Lipase -     metoCLOPramide  (REGLAN ) 10 MG tablet; Take 1 tablet (10 mg total) by mouth 4 (four) times daily as needed for nausea or vomiting.  Return in about 4 weeks (around 02/02/2024) for new OB appointment.  Future Appointments  Date Time Provider Department Center  02/16/2024 10:30 AM Erik Kieth BROCKS, MD [facility]    Kieth BROCKS Erik, MD

## 2024-01-05 ENCOUNTER — Ambulatory Visit: Payer: Medicaid Other | Admitting: Obstetrics and Gynecology

## 2024-01-05 ENCOUNTER — Encounter: Payer: Self-pay | Admitting: Obstetrics and Gynecology

## 2024-01-05 VITALS — BP 120/79 | HR 105 | Temp 98.1°F | Ht 65.0 in | Wt 200.0 lb

## 2024-01-05 DIAGNOSIS — R112 Nausea with vomiting, unspecified: Secondary | ICD-10-CM

## 2024-01-05 DIAGNOSIS — N390 Urinary tract infection, site not specified: Secondary | ICD-10-CM | POA: Diagnosis not present

## 2024-01-05 DIAGNOSIS — Z3201 Encounter for pregnancy test, result positive: Secondary | ICD-10-CM

## 2024-01-05 DIAGNOSIS — R300 Dysuria: Secondary | ICD-10-CM

## 2024-01-05 DIAGNOSIS — Z3A01 Less than 8 weeks gestation of pregnancy: Secondary | ICD-10-CM

## 2024-01-05 LAB — POCT URINALYSIS DIPSTICK
Bilirubin, UA: NEGATIVE
Glucose, UA: NEGATIVE
Nitrite, UA: POSITIVE
Protein, UA: NEGATIVE
Spec Grav, UA: 1.01 (ref 1.010–1.025)
Urobilinogen, UA: 0.2 U/dL
pH, UA: 5 (ref 5.0–8.0)

## 2024-01-05 LAB — POCT URINE PREGNANCY: Preg Test, Ur: POSITIVE — AB

## 2024-01-05 MED ORDER — METOCLOPRAMIDE HCL 10 MG PO TABS: 10.0000 mg | ORAL_TABLET | Freq: Four times a day (QID) | ORAL | 2 refills | Status: AC | PRN

## 2024-01-05 MED ORDER — AMOXICILLIN-POT CLAVULANATE 875-125 MG PO TABS: 1.0000 | ORAL_TABLET | Freq: Two times a day (BID) | ORAL | 0 refills | Status: AC

## 2024-01-05 NOTE — Progress Notes (Signed)
 Pt has positive UPT

## 2024-01-06 ENCOUNTER — Encounter: Payer: Self-pay | Admitting: Obstetrics and Gynecology

## 2024-01-06 LAB — COMPREHENSIVE METABOLIC PANEL
ALT: 11 [IU]/L (ref 0–32)
AST: 18 [IU]/L (ref 0–40)
Albumin: 4.6 g/dL (ref 4.0–5.0)
Alkaline Phosphatase: 104 [IU]/L (ref 44–121)
BUN/Creatinine Ratio: 13 (ref 9–23)
BUN: 10 mg/dL (ref 6–20)
Bilirubin Total: 0.7 mg/dL (ref 0.0–1.2)
CO2: 19 mmol/L — ABNORMAL LOW (ref 20–29)
Calcium: 9.8 mg/dL (ref 8.7–10.2)
Chloride: 103 mmol/L (ref 96–106)
Creatinine, Ser: 0.8 mg/dL (ref 0.57–1.00)
Globulin, Total: 2.9 g/dL (ref 1.5–4.5)
Glucose: 92 mg/dL (ref 70–99)
Potassium: 4.2 mmol/L (ref 3.5–5.2)
Sodium: 138 mmol/L (ref 134–144)
Total Protein: 7.5 g/dL (ref 6.0–8.5)
eGFR: 107 mL/min/{1.73_m2} (ref >59)

## 2024-01-06 LAB — CBC
Hematocrit: 45.1 % (ref 34.0–46.6)
Hemoglobin: 15 g/dL (ref 11.1–15.9)
MCH: 31.3 pg (ref 26.6–33.0)
MCHC: 33.3 g/dL (ref 31.5–35.7)
MCV: 94 fL (ref 79–97)
Platelets: 283 10*3/uL (ref 150–450)
RBC: 4.79 x10E6/uL (ref 3.77–5.28)
RDW: 12.1 % (ref 11.7–15.4)
WBC: 13.8 10*3/uL — ABNORMAL HIGH (ref 3.4–10.8)

## 2024-01-06 LAB — LIPASE: Lipase: 30 U/L (ref 14–72)

## 2024-01-07 ENCOUNTER — Encounter: Payer: Self-pay | Admitting: Obstetrics and Gynecology

## 2024-01-07 LAB — URINE CULTURE, OB REFLEX

## 2024-01-07 LAB — CULTURE, OB URINE

## 2024-02-02 ENCOUNTER — Institutional Professional Consult (permissible substitution): Payer: Medicaid Other | Admitting: Obstetrics and Gynecology

## 2024-02-03 ENCOUNTER — Encounter: Payer: Self-pay | Admitting: Obstetrics and Gynecology

## 2024-02-03 ENCOUNTER — Ambulatory Visit: Admitting: Obstetrics and Gynecology

## 2024-02-03 VITALS — BP 128/77 | HR 76 | Ht 65.0 in | Wt 214.0 lb

## 2024-02-03 DIAGNOSIS — Z3009 Encounter for other general counseling and advice on contraception: Secondary | ICD-10-CM | POA: Diagnosis not present

## 2024-02-03 DIAGNOSIS — Z30017 Encounter for initial prescription of implantable subdermal contraceptive: Secondary | ICD-10-CM

## 2024-02-03 MED ORDER — ETONOGESTREL 68 MG ~~LOC~~ IMPL
68.0000 mg | DRUG_IMPLANT | Freq: Once | SUBCUTANEOUS | Status: AC
Start: 2024-02-03 — End: 2024-02-03
  Administered 2024-02-03: 68 mg via SUBCUTANEOUS

## 2024-02-03 NOTE — Progress Notes (Signed)
   RETURN GYNECOLOGY VISIT  Subjective:  Joann Reid is a 23 y.o. G1P1001 with recent IAB presenting for discussion of contraception  Has tried the following: - IUD: expelled/malpositioned twice - Nexplanon: where it was placed rubbed against her clothing and was really bothersome to her - Pills: can't remember to take them every day - Nuvaring: had significant vaginal discharge. Used continuously  She does not want to do the patch due to hygiene concerns and she doesn't want to do Depo due to weight gain.  Has heavy/bothersome periods.   She is considering permanent sterilization. She is happy with her child and does not want to be pregnant ever again.   No contraindications to estrogen.   Objective:   Vitals:   02/03/24 0955  BP: 128/77  Pulse: 76  Weight: 214 lb (97.1 kg)  Height: 5\' 5"  (1.651 m)   General:  Alert, oriented and cooperative. Patient is in no acute distress.  Skin: Skin is warm and dry. No rash noted.   Cardiovascular: Normal heart rate noted  Respiratory: Normal respiratory effort, no problems with respiration noted   Assessment and Plan:  Joann Reid is a 23 y.o. presenting for:  Nexplanon insertion S/p uncomplicated insertion, see procedure note -     etonogestrel (NEXPLANON) implant 68 mg  Encounter for general counseling and advice on contraceptive management Reviewed options outlined above. Discussed trying nexplanon or nuvaring again. Discussed best contraceptive efficacy would come from Nexplanon with down side of irregular bleeding, but that Nuvaring would help her have predictable and likely lighter/shorter periods.  We also discussed permanent sterilization. I reviewed that it is definitely an option, but carries higher risk of regret given her age and emphasized that this would be a permanent procedure. We also discussed surgical risks including 2-4 week recovery time, intra op injury, life threatening bleeding, infection, and  medical complications.  After counseling, she would like to try Nexplanon again. Will place 1 cm more proximal and more superior to last placement to see if that is more comfortable. Still within manufacturer recommended location for placement.   Lennart Pall, MD

## 2024-02-03 NOTE — Progress Notes (Signed)
   GYNECOLOGY OFFICE PROCEDURE NOTE  Joann Reid is a 23 y.o. G2P1011 here for Nexplanon insertion.  Last pap smear was on 04/20/22 and was normal.  No other gynecologic concerns.  Nexplanon Insertion Procedure Patient identified, informed consent performed, consent signed.   Patient does understand that irregular bleeding is a very common side effect of this medication. She was advised to have backup contraception for one week after placement. Pregnancy test in clinic today was deferred given recent IAB with no unprotected intercourse since. Discussed Nexplanon will not have negative effects on a developing pregnancy, but encouraged her to take UPT 5 weeks after IAB as previously recommended.  Appropriate time out taken.    Patient's left arm was prepped and draped in the usual sterile fashion. The ruler used to measure and mark insertion area.  Patient was prepped with alcohol swab and then injected with 3 ml of 1% lidocaine.  She was prepped with betadine, Nexplanon removed from packaging,  Device confirmed in needle, then inserted full length of needle and withdrawn per handbook instructions. Nexplanon was able to palpated in the patient's arm; patient palpated the insert herself. There was minimal blood loss.  Patient insertion site covered with guaze and a pressure bandage to reduce any bruising.    The patient tolerated the procedure well and was given post procedure instructions. Advised to use backup method x 1 week  Harvie Bridge, MD Obstetrician & Gynecologist, [facility] for [employer], [facility]

## 2024-02-16 ENCOUNTER — Encounter: Payer: Medicaid Other | Admitting: Obstetrics and Gynecology

## 2024-04-10 ENCOUNTER — Ambulatory Visit: Admitting: Obstetrics & Gynecology

## 2024-04-11 ENCOUNTER — Ambulatory Visit: Admitting: Obstetrics and Gynecology

## 2024-04-11 ENCOUNTER — Other Ambulatory Visit (HOSPITAL_COMMUNITY)
Admission: RE | Admit: 2024-04-11 | Discharge: 2024-04-11 | Disposition: A | Discharge status: Home / Self Care | Source: Ambulatory Visit | Attending: Obstetrics and Gynecology | Admitting: Obstetrics and Gynecology

## 2024-04-11 ENCOUNTER — Encounter: Payer: Self-pay | Admitting: Obstetrics and Gynecology

## 2024-04-11 VITALS — BP 123/74 | HR 68 | Ht 65.0 in | Wt 216.0 lb

## 2024-04-11 DIAGNOSIS — R319 Hematuria, unspecified: Secondary | ICD-10-CM

## 2024-04-11 DIAGNOSIS — R87612 Low grade squamous intraepithelial lesion on cytologic smear of cervix (LGSIL): Secondary | ICD-10-CM | POA: Diagnosis present

## 2024-04-11 DIAGNOSIS — N939 Abnormal uterine and vaginal bleeding, unspecified: Secondary | ICD-10-CM | POA: Insufficient documentation

## 2024-04-11 LAB — POCT URINALYSIS DIPSTICK
Bilirubin, UA: NEGATIVE
Glucose, UA: NEGATIVE
Leukocytes, UA: NEGATIVE
Nitrite, UA: NEGATIVE
Protein, UA: NEGATIVE
Spec Grav, UA: 1.015 (ref 1.010–1.025)
pH, UA: 5 (ref 5.0–8.0)

## 2024-04-11 NOTE — Progress Notes (Signed)
 GYNECOLOGY ENCOUNTER NOTE  History:      Joann Reid is a 23 y.o. G21P1011 female here for concerns regarding 2-3 days a vaginal bleeding. Concerned the bleeding is coming from her urine. She reports mild dysuria, no back pain or fever.  Requesting STI treatment today.  Has nexplanon  in place- no period since placement.  TAB in February.   Gynecologic History Patient's last menstrual period was 01/03/2024. Contraception: Nexplanon  Last Pap: 2024.  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  Obstetric History OB History  Gravida Para Term Preterm AB Living  2 1 1  1 1   SAB IAB Ectopic Multiple Live Births   1  0 1    # Outcome Date GA Lbr Len/2nd Weight Sex Type Anes PTL Lv  2 IAB 01/17/23          1 Term 10/25/22 [redacted]w[redacted]d / 00:16 7 lb 4.1 oz (3.29 kg) F Vag-Spont EPI  LIV    Past Medical History:  Diagnosis Date   Asthma     Past Surgical History:  Procedure Laterality Date   EYE SURGERY     IUD INSERTION  10/25/2022    Current Outpatient Medications on File Prior to Visit  Medication Sig Dispense Refill   metoCLOPramide  (REGLAN ) 10 MG tablet Take 1 tablet (10 mg total) by mouth 4 (four) times daily as needed for nausea or vomiting. (Patient not taking: Reported on 04/11/2024) 30 tablet 2   Multiple Vitamin (MULTIVITAMIN) tablet Take 1 tablet by mouth daily. (Patient not taking: Reported on 04/11/2024)     No current facility-administered medications on file prior to visit.    Allergies  Allergen Reactions   Hydrocodone-Acetaminophen  Nausea Only and Other (See Comments)    Social History:  reports that she has never smoked. She has never used smokeless tobacco. She reports that she does not drink alcohol and does not use drugs.  Family History  Problem Relation Age of Onset   Hypertension Mother    Diabetes Maternal Grandmother     The following  portions of the patient's history were reviewed and updated as appropriate: allergies, current medications, past family history, past medical history, past social history, past surgical history and problem list.  Review of Systems Pertinent items noted in HPI and remainder of comprehensive ROS otherwise negative.  Physical Exam:  BP 123/74   Pulse 68   Ht 5\' 5"  (1.651 m)   Wt 216 lb (98 kg)   LMP 01/03/2024   BMI 35.94 kg/m  CONSTITUTIONAL: Well-developed, well-nourished female in no acute distress.  HENT:  Normocephalic MUSCULOSKELETAL: Normal range of motion.  NEUROLOGIC: Alert and oriented to person, place, and time. Normal muscle tone coordination.  PSYCHIATRIC: Normal mood and affect. Normal behavior.  ABDOMEN: Soft, no distention noted.  No tenderness, rebound or guarding.  PELVIC: Normal appearing external genitalia and urethral meatus; normal appearing vaginal mucosa and cervix.  No abnormal vaginal discharge noted. Small amount of period like blood in the vault.  Pap smear obtained.  Normal uterine size, no other palpable masses, no uterine or adnexal tenderness.  Performed in the presence of a chaperone.   Assessment and Plan:   1. Low grade squamous intraepithelial lesion on cytologic smear of cervix (LGSIL) (Primary)  - Cytology - PAP - Cervicovaginal ancillary only - RPR+HBsAg+HCVAb+...  2. Abnormal vaginal bleeding  - urine dip without acute findings - negative  nitrates - Urine culture pending  - Cytology - PAP - Cervicovaginal ancillary only - RPR+HBsAg+HCVAb+... - Beta hCG quant (ref lab) - No period plus recent TAB and no follow up.     Shawan Tosh, Juliette Oh, NP [facility] for [employer], [facility]

## 2024-04-12 LAB — BETA HCG QUANT (REF LAB): hCG Quant: <1 m[IU]/mL

## 2024-04-13 LAB — CYTOLOGY - PAP
Chlamydia: NEGATIVE
Comment: NEGATIVE
Comment: NEGATIVE
Comment: NORMAL
Neisseria Gonorrhea: NEGATIVE
Trichomonas: NEGATIVE

## 2024-04-13 LAB — CERVICOVAGINAL ANCILLARY ONLY
Bacterial Vaginitis (gardnerella): NEGATIVE
Candida Glabrata: NEGATIVE
Candida Vaginitis: NEGATIVE
Comment: NEGATIVE
Comment: NEGATIVE
Comment: NEGATIVE

## 2024-04-13 LAB — URINE CULTURE

## 2024-04-13 LAB — RPR+HBSAG+HCVAB+...
HIV Screen 4th Generation wRfx: NONREACTIVE
Hep C Virus Ab: NONREACTIVE
Hepatitis B Surface Ag: NEGATIVE
RPR Ser Ql: NONREACTIVE

## 2024-04-14 ENCOUNTER — Ambulatory Visit: Payer: Self-pay | Admitting: Obstetrics and Gynecology

## 2024-04-14 DIAGNOSIS — R87612 Low grade squamous intraepithelial lesion on cytologic smear of cervix (LGSIL): Secondary | ICD-10-CM | POA: Insufficient documentation

## 2024-04-20 ENCOUNTER — Ambulatory Visit

## 2024-04-28 ENCOUNTER — Telehealth

## 2024-04-28 ENCOUNTER — Ambulatory Visit

## 2024-07-04 ENCOUNTER — Encounter: Payer: Self-pay | Admitting: Obstetrics and Gynecology

## 2024-07-04 ENCOUNTER — Ambulatory Visit: Admitting: Obstetrics and Gynecology

## 2024-07-04 VITALS — BP 104/74 | HR 102 | Ht 65.0 in | Wt 223.0 lb

## 2024-07-04 DIAGNOSIS — R102 Pelvic and perineal pain: Secondary | ICD-10-CM

## 2024-07-04 DIAGNOSIS — Z3046 Encounter for surveillance of implantable subdermal contraceptive: Secondary | ICD-10-CM

## 2024-07-04 NOTE — Progress Notes (Signed)
   GYNECOLOGY PROGRESS NOTE  History:  23 y.o. G2P1011 presents to [facility] office today for problem gyn visit. She reports having an achy pain inside of the vagina. States that it feels like there is something stuck in there. Denies use of tampons or recent sexual activity. Denies vaginal discharge, odor, or irritation. Reports having headaches since nexplanon  was placed.  She denies dizziness, shortness of breath, n/v, or fever/chills.    The following portions of the patient's history were reviewed and updated as appropriate: allergies, current medications, past family history, past medical history, past social history, past surgical history and problem list. Last pap smear on 04/11/24 was abnormal LGSIL, Negative HRHPV.  Health Maintenance Due  Topic Date Due   HPV VACCINES (1 - 3-dose series) Never done   Meningococcal B Vaccine (1 of 2 - Standard) Never done   Pneumococcal Vaccine: 19-49 Years (1 of 2 - PCV) 01/13/2020   COVID-19 Vaccine (3 - 2024-25 season) 08/01/2023   INFLUENZA VACCINE  06/30/2024     Review of Systems:  Pertinent items are noted in HPI.   Objective:  Physical Exam Blood pressure 104/74, pulse (!) 102, height 5' 5 (1.651 m), weight 101.2 kg, last menstrual period 01/04/2024, not currently breastfeeding. VS reviewed, nursing note reviewed,  Constitutional: well developed, well nourished, no distress HEENT: normocephalic CV: normal rate Pulm/chest wall: normal effort Breast Exam: deferred Abdomen: soft Neuro: alert and oriented x 3 Skin: warm, dry Psych: affect normal Pelvic exam: Cervix pink, visually closed, without lesion, scant white creamy discharge, vaginal walls and external genitalia normal Bimanual exam: Cervix 0/long/high, firm, anterior, neg CMT, uterus nontender, nonenlarged, adnexa without tenderness, enlargement, or mass       GYNECOLOGY OFFICE PROCEDURE NOTE  Joann Reid is a 23 y.o. G2P1011 here for Nexplanon   removal.  Last pap smear was on 04/11/24 and LSIL. No other gynecologic concerns. Plan to repeat pap in 1 year.   Nexplanon  Removal Patient identified, informed consent performed, consent signed.   Patient does understand that irregular bleeding is a very common side effect of this medication. Discussed other risks and benefits of this contraception modality. She was advised to have backup contraception for one week after replacement of the implant. Pregnancy test in clinic today was negative.  Appropriate time out taken. Nexplanon  site identified in left arm.  Area prepped in usual sterile fashon. One ml of 1% lidocaine  was used to anesthetize the area at the distal end of the implant. A small stab incision was made right beside the implant on the distal portion. The Nexplanon  rod was grasped using hemostats and removed without difficulty. There was minimal blood loss. There were no complications. Area was then injected with 3 ml of 1 % lidocaine . There was minimal blood loss. Patient incision site site covered with gauze and a pressure bandage to reduce any bruising. The patient tolerated the procedure well and was given post procedure instructions.    Derrek JINNY Freund, NP Student  Dorita Delon FERNS, NP 07/04/2024 10:42 AM    Assessment & Plan:  Encounter for Nexplanon  removal Nexplanon  removal today without difficulty. Patient does not want    Return for Vaginal pain. Plan to do full workup.   Derrek JINNY Freund, RN 9:43 AM   Dorita Delon FERNS, NP 07/04/2024 10:42 AM

## 2024-07-11 ENCOUNTER — Other Ambulatory Visit: Payer: Self-pay | Admitting: Obstetrics and Gynecology

## 2024-07-11 ENCOUNTER — Ambulatory Visit

## 2024-07-11 MED ORDER — NEXTSTELLIS 3-14.2 MG PO TABS: 1.0000 | ORAL_TABLET | Freq: Every day | ORAL | 11 refills | Status: AC

## 2024-10-23 ENCOUNTER — Telehealth: Payer: Self-pay | Admitting: *Deleted

## 2024-10-23 NOTE — Telephone Encounter (Signed)
 Returned call from 1:06 PM. No voicemail set up.

## 2024-12-13 ENCOUNTER — Ambulatory Visit: Admitting: Obstetrics and Gynecology
